# Patient Record
Sex: Male | Born: 1975 | Race: White | Hispanic: No
Health system: Midwestern US, Community
[De-identification: ages and names within clinical notes are randomized; demographics above are authoritative.]

## PROBLEM LIST (undated history)

## (undated) DIAGNOSIS — R000 Tachycardia, unspecified: Secondary | ICD-10-CM

## (undated) DIAGNOSIS — Z1211 Encounter for screening for malignant neoplasm of colon: Secondary | ICD-10-CM

## (undated) DIAGNOSIS — M549 Dorsalgia, unspecified: Secondary | ICD-10-CM

## (undated) DIAGNOSIS — K5792 Diverticulitis of intestine, part unspecified, without perforation or abscess without bleeding: Secondary | ICD-10-CM

## (undated) DIAGNOSIS — G894 Chronic pain syndrome: Secondary | ICD-10-CM

## (undated) DIAGNOSIS — M961 Postlaminectomy syndrome, not elsewhere classified: Secondary | ICD-10-CM

## (undated) DIAGNOSIS — D509 Iron deficiency anemia, unspecified: Secondary | ICD-10-CM

## (undated) DIAGNOSIS — R7989 Other specified abnormal findings of blood chemistry: Secondary | ICD-10-CM

## (undated) DIAGNOSIS — M5134 Other intervertebral disc degeneration, thoracic region: Secondary | ICD-10-CM

## (undated) DIAGNOSIS — J45909 Unspecified asthma, uncomplicated: Secondary | ICD-10-CM

## (undated) DIAGNOSIS — G43909 Migraine, unspecified, not intractable, without status migrainosus: Secondary | ICD-10-CM

## (undated) DIAGNOSIS — M47816 Spondylosis without myelopathy or radiculopathy, lumbar region: Secondary | ICD-10-CM

## (undated) HISTORY — PX: BUNIONECTOMY: SHX129

## (undated) HISTORY — PX: BACK SURGERY: SHX140

## (undated) HISTORY — PX: ELBOW SURGERY: SHX618

---

## 2016-09-06 ENCOUNTER — Emergency Department (HOSPITAL_BASED_OUTPATIENT_CLINIC_OR_DEPARTMENT_OTHER)
Admission: EM | Admit: 2016-09-06 | Discharge: 2016-09-06 | Disposition: A | Discharge status: Home / Self Care | Attending: Physician Assistant | Admitting: Physician Assistant

## 2016-09-06 ENCOUNTER — Emergency Department (HOSPITAL_BASED_OUTPATIENT_CLINIC_OR_DEPARTMENT_OTHER)

## 2016-09-06 ENCOUNTER — Encounter (HOSPITAL_BASED_OUTPATIENT_CLINIC_OR_DEPARTMENT_OTHER): Payer: Self-pay | Admitting: *Deleted

## 2016-09-06 DIAGNOSIS — R42 Dizziness and giddiness: Secondary | ICD-10-CM | POA: Diagnosis present

## 2016-09-06 DIAGNOSIS — R110 Nausea: Secondary | ICD-10-CM | POA: Insufficient documentation

## 2016-09-06 DIAGNOSIS — Z79899 Other long term (current) drug therapy: Secondary | ICD-10-CM | POA: Insufficient documentation

## 2016-09-06 HISTORY — DX: Dorsalgia, unspecified: M54.9

## 2016-09-06 HISTORY — DX: Migraine, unspecified, not intractable, without status migrainosus: G43.909

## 2016-09-06 LAB — CBC WITH DIFFERENTIAL/PLATELET
Basophils Absolute: 0 10*3/uL (ref 0.0–0.1)
Basophils Relative: 0 %
EOS ABS: 0.2 10*3/uL (ref 0.0–0.7)
EOS PCT: 2 %
HCT: 46.2 % (ref 39.0–52.0)
Hemoglobin: 15.7 g/dL (ref 13.0–17.0)
LYMPHS ABS: 1.9 10*3/uL (ref 0.7–4.0)
LYMPHS PCT: 24 %
MCH: 28 pg (ref 26.0–34.0)
MCHC: 34 g/dL (ref 30.0–36.0)
MCV: 82.5 fL (ref 78.0–100.0)
MONO ABS: 0.8 10*3/uL (ref 0.1–1.0)
MONOS PCT: 9 %
Neutro Abs: 5.1 10*3/uL (ref 1.7–7.7)
Neutrophils Relative %: 65 %
PLATELETS: 284 10*3/uL (ref 150–400)
RBC: 5.6 MIL/uL (ref 4.22–5.81)
RDW: 13.2 % (ref 11.5–15.5)
WBC: 8 10*3/uL (ref 4.0–10.5)

## 2016-09-06 LAB — COMPREHENSIVE METABOLIC PANEL
ALK PHOS: 72 U/L (ref 38–126)
ALT: 36 U/L (ref 17–63)
AST: 28 U/L (ref 15–41)
Albumin: 4.4 g/dL (ref 3.5–5.0)
Anion gap: 8 (ref 5–15)
BILIRUBIN TOTAL: 0.5 mg/dL (ref 0.3–1.2)
BUN: 18 mg/dL (ref 6–20)
CALCIUM: 9.6 mg/dL (ref 8.9–10.3)
CO2: 27 mmol/L (ref 22–32)
Chloride: 104 mmol/L (ref 101–111)
Creatinine, Ser: 1.06 mg/dL (ref 0.61–1.24)
GFR calc Af Amer: >60 mL/min (ref >60)
GFR calc non Af Amer: >60 mL/min (ref >60)
GLUCOSE: 98 mg/dL (ref 65–99)
POTASSIUM: 3.8 mmol/L (ref 3.5–5.1)
SODIUM: 139 mmol/L (ref 135–145)
TOTAL PROTEIN: 7.8 g/dL (ref 6.5–8.1)

## 2016-09-06 LAB — URINALYSIS, ROUTINE W REFLEX MICROSCOPIC
Bilirubin Urine: NEGATIVE
GLUCOSE, UA: NEGATIVE mg/dL
Hgb urine dipstick: NEGATIVE
KETONES UR: NEGATIVE mg/dL
LEUKOCYTES UA: NEGATIVE
Nitrite: NEGATIVE
PH: 6.5 (ref 5.0–8.0)
Protein, ur: NEGATIVE mg/dL
SPECIFIC GRAVITY, URINE: 1.022 (ref 1.005–1.030)

## 2016-09-06 LAB — TROPONIN I: Troponin I: <0.03 ng/mL (ref <0.03)

## 2016-09-06 LAB — CBG MONITORING, ED: Glucose-Capillary: 117 mg/dL — ABNORMAL HIGH (ref 65–99)

## 2016-09-06 NOTE — ED Triage Notes (Signed)
Pt reports multiple episodes of nausea, tremors, dizziness.  Has been seen and treated by PCP and has possible diabetes.  Pt drove himself here, is ambulatory.  Denies symptoms at this time.  Denies pain.

## 2016-09-06 NOTE — ED Notes (Addendum)
Alert, NAD, calm, interactive, resps e/u, speaking in clear complete sentences, no dyspnea noted, skin W&D, VSS, (denies: pain, sob, nausea, dizziness or visual changes at this time). Mentions concern for recent dizziness with associated nausea, which can occur while sitting, also increased thirst and frequency (denies sx at this time). [facility] work done. Fasting blood sugar was 127.

## 2016-09-06 NOTE — ED Provider Notes (Signed)
[facility] Note   CSN: [id number] Arrival date & time: 09/06/16  1727  By signing my name below, I, Octavia Heir, attest that this documentation has been prepared under the direction and in the presence of Sharilyn Sites, PA-C.  Electronically Signed: Octavia Heir, ED Scribe. 09/06/16. 6:29 PM.    History   Chief Complaint Chief Complaint  Patient presents with  . Dizziness   The history is provided by the patient. No language interpreter was used.    HPI Comments: Isaiah Gentry is a 41 y.o. male who presents to the Emergency Department complaining of intermittent, moderate episodes of "tremors" for the past couple of months. He reports associated light-headedness, dizziness that he describes as room-spinning, and nausea when this occurs. He further notes feeling very thirsty despite staying hydrated with water for the past several days. Pt says that his symptoms have been progressively worse over the past month, stating he has missed work for the first time in years. Pt says when an episode will occur, he will feel his face become hot, have nausea, and then the dizziness will appear. He notes the episodes occur late afternoon to early evening but for the past month it has been been recurring almost daily. Pt expresses he does not have any pain when his symptoms come on. Pt has been seen by his PCP and was told he may possibly have diabetes. Pt denies hx of cardiac problems or thyroid issues. He has not been on any new medications. He denies chest pain, shortness of breath, vomiting, and loss of appetite.   No focal numbness or weakness.  No blurred vision, confusion, changes in speech, difficulty walking.  No headaches or neck pain.  No seizure activity.  Past Medical History:  Diagnosis Date  . Back pain   . Migraine     There are no active problems to display for this patient.   History reviewed. No pertinent surgical history.     Home Medications     Prior to Admission medications   Medication Sig Start Date End Date Taking? Authorizing Provider  amitriptyline (ELAVIL) 50 MG tablet Take 50 mg by mouth at bedtime.   Yes Historical Provider, MD  HYDROcodone-acetaminophen (NORCO/VICODIN) 5-325 MG tablet Take 1 tablet by mouth every 6 (six) hours as needed for moderate pain.   Yes Historical Provider, MD  SUMAtriptan (IMITREX) 50 MG tablet Take 50 mg by mouth every 2 (two) hours as needed for migraine. May repeat in 2 hours if headache persists or recurs.   Yes Historical Provider, MD    Family History History reviewed. No pertinent family history.  Social History Social History  Substance Use Topics  . Smoking status: Never Smoker  . Smokeless tobacco: Never Used  . Alcohol use No     Allergies   Azithromycin   Review of Systems Review of Systems  Constitutional: Negative for appetite change and fever.  Respiratory: Negative for shortness of breath.   Cardiovascular: Negative for chest pain.  Gastrointestinal: Positive for nausea. Negative for vomiting.  Neurological: Positive for dizziness and light-headedness.  All other systems reviewed and are negative.    Physical Exam Updated Vital Signs BP (!) 140/105 (BP Location: Right Arm)   Pulse 87   Temp 97.5 F (36.4 C) (Oral)   Resp 16   Ht 6' (1.829 m)   Wt 245 lb (111.1 kg)   SpO2 100%   BMI 33.23 kg/m   Physical Exam  Constitutional: He is  oriented to person, place, and time. He appears well-developed and well-nourished. No distress.  HENT:  Head: Normocephalic and atraumatic.  Right Ear: External ear normal.  Left Ear: External ear normal.  Mouth/Throat: Oropharynx is clear and moist.  Eyes: Conjunctivae and EOM are normal. Pupils are equal, round, and reactive to light.  Neck: Normal range of motion and full passive range of motion without pain. Neck supple. No neck rigidity.  No rigidity, no meningismus  Cardiovascular: Normal rate, regular rhythm and  normal heart sounds.   No murmur heard. Pulmonary/Chest: Effort normal and breath sounds normal. No respiratory distress. He has no wheezes. He has no rhonchi.  Abdominal: Soft. Bowel sounds are normal. There is no tenderness. There is no rebound and no guarding.  Musculoskeletal: Normal range of motion. He exhibits no edema.  Neurological: He is alert and oriented to person, place, and time. He has normal strength. He displays no tremor. No cranial nerve deficit or sensory deficit. He displays no seizure activity.  AAOx3, answering questions and following commands appropriately; equal strength UE and LE bilaterally; CN grossly intact; moves all extremities appropriately without ataxia; no focal neuro deficits or facial asymmetry appreciated; no tremors or seizure activity  Skin: Skin is warm and dry. No rash noted. He is not diaphoretic.  Psychiatric: He has a normal mood and affect. His behavior is normal. Thought content normal.  Nursing note and vitals reviewed.    ED Treatments / Results  DIAGNOSTIC STUDIES: Oxygen Saturation is 100% on RA, normal by my interpretation.  COORDINATION OF CARE:  6:28 PM Discussed treatment plan with pt at bedside and pt agreed to plan.  Labs (all labs ordered are listed, but only abnormal results are displayed) Labs Reviewed  CBG MONITORING, ED - Abnormal; Notable for the following:       Result Value   Glucose-Capillary 117 (*)    All other components within normal limits  CBC WITH DIFFERENTIAL/PLATELET  COMPREHENSIVE METABOLIC PANEL  TROPONIN I  URINALYSIS, ROUTINE W REFLEX MICROSCOPIC  TSH  T4, FREE    EKG  EKG Interpretation None       Radiology Dg Chest 2 View  Result Date: 09/06/2016 CLINICAL DATA:  Patient with acute onset dizziness and nausea. EXAM: CHEST  2 VIEW COMPARISON:  None. FINDINGS: The heart size and mediastinal contours are within normal limits. Both lungs are clear. The visualized skeletal structures are  unremarkable. IMPRESSION: No active cardiopulmonary disease. Electronically Signed   By: Annia Beltrew  Davis M.D.   On: 09/06/2016 19:24    Procedures Procedures (including critical care time)  Medications Ordered in ED Medications - No data to display   Initial Impression / Assessment and Plan / ED Course  I have reviewed the triage vital signs and the nursing notes.  Pertinent labs & imaging results that were available during my care of the patient were reviewed by me and considered in my medical decision making (see chart for details).  41 year old male here with episodic dizziness, facial flushing, and tremors. Has been increasing in frequency over the past month. He is afebrile and nontoxic. His neurologic exam is nonfocal. No signs or symptoms concerning for seizures. Labwork obtained, no acute findings. EKG non-ischemic.  CXR clear.  BP mildly elevated on arrival here, has normalized here without intervention.  Has remained asymptomatic here in emergency department. Neurologic exam non-focal.  Thyroid studies have been sent and should result sometime tomorrow. These will be forwarded to his primary care doctor. I recommended that  he follow-up closely with his family doctor for ongoing workup as etiology of his symptoms unknown at this time.  Discussed plan with patient, he acknowledged understanding and agreed with plan of care.  Return precautions given for new or worsening symptoms.  Final Clinical Impressions(s) / ED Diagnoses   Final diagnoses:  Episodic lightheadedness    New Prescriptions New Prescriptions   No medications on file   I personally performed the services described in this documentation, which was scribed in my presence. The recorded information has been reviewed and is accurate.   Garlon Hatchet, PA-C 09/06/16 2229    Courteney Randall An, MD 09/06/16 [id number]

## 2016-09-06 NOTE — ED Notes (Signed)
Patient transported to X-ray 

## 2016-09-06 NOTE — Discharge Instructions (Signed)
Your lab work today looks normal. Your thyroid study should come back tomorrow. These will be forwarded to your primary care doctor. If these are abnormal, you'll be notified. Recommend to follow-up with your doctor next week. Return here for new/worsening symptoms-- chest pain, shortness of breath, syncope, etc.

## 2016-09-06 NOTE — ED Notes (Addendum)
Updated, NAD, calm, VSS, EDPA into room.

## 2016-09-07 LAB — TSH: TSH: 2.423 u[IU]/mL (ref 0.350–4.500)

## 2016-09-07 LAB — T4, FREE: Free T4: 0.77 ng/dL (ref 0.61–1.12)

## 2018-03-04 ENCOUNTER — Emergency Department (HOSPITAL_BASED_OUTPATIENT_CLINIC_OR_DEPARTMENT_OTHER)
Admission: EM | Admit: 2018-03-04 | Discharge: 2018-03-04 | Disposition: A | Discharge status: Home / Self Care | Payer: No Typology Code available for payment source | Attending: Emergency Medicine | Admitting: Emergency Medicine

## 2018-03-04 ENCOUNTER — Other Ambulatory Visit: Payer: Self-pay

## 2018-03-04 ENCOUNTER — Emergency Department (HOSPITAL_BASED_OUTPATIENT_CLINIC_OR_DEPARTMENT_OTHER): Payer: No Typology Code available for payment source

## 2018-03-04 ENCOUNTER — Encounter (HOSPITAL_BASED_OUTPATIENT_CLINIC_OR_DEPARTMENT_OTHER): Payer: Self-pay | Admitting: Emergency Medicine

## 2018-03-04 DIAGNOSIS — Z79899 Other long term (current) drug therapy: Secondary | ICD-10-CM | POA: Insufficient documentation

## 2018-03-04 DIAGNOSIS — R109 Unspecified abdominal pain: Secondary | ICD-10-CM | POA: Diagnosis present

## 2018-03-04 DIAGNOSIS — N200 Calculus of kidney: Secondary | ICD-10-CM | POA: Insufficient documentation

## 2018-03-04 HISTORY — DX: Diverticulitis of intestine, part unspecified, without perforation or abscess without bleeding: K57.92

## 2018-03-04 HISTORY — DX: Other intervertebral disc degeneration, thoracic region: M51.34

## 2018-03-04 HISTORY — DX: Unspecified asthma, uncomplicated: J45.909

## 2018-03-04 HISTORY — DX: Spondylosis without myelopathy or radiculopathy, lumbar region: M47.816

## 2018-03-04 LAB — CBC WITH DIFFERENTIAL/PLATELET
Basophils Absolute: 0 10*3/uL (ref 0.0–0.1)
Basophils Relative: 0 %
EOS PCT: 3 %
Eosinophils Absolute: 0.3 10*3/uL (ref 0.0–0.7)
HCT: 45.1 % (ref 39.0–52.0)
Hemoglobin: 15.4 g/dL (ref 13.0–17.0)
LYMPHS ABS: 3 10*3/uL (ref 0.7–4.0)
LYMPHS PCT: 35 %
MCH: 28.3 pg (ref 26.0–34.0)
MCHC: 34.1 g/dL (ref 30.0–36.0)
MCV: 82.9 fL (ref 78.0–100.0)
MONO ABS: 0.7 10*3/uL (ref 0.1–1.0)
MONOS PCT: 8 %
Neutro Abs: 4.6 10*3/uL (ref 1.7–7.7)
Neutrophils Relative %: 54 %
PLATELETS: 277 10*3/uL (ref 150–400)
RBC: 5.44 MIL/uL (ref 4.22–5.81)
RDW: 12.8 % (ref 11.5–15.5)
WBC: 8.6 10*3/uL (ref 4.0–10.5)

## 2018-03-04 LAB — BASIC METABOLIC PANEL
Anion gap: 11 (ref 5–15)
BUN: 18 mg/dL (ref 6–20)
CHLORIDE: 105 mmol/L (ref 98–111)
CO2: 24 mmol/L (ref 22–32)
Calcium: 9 mg/dL (ref 8.9–10.3)
Creatinine, Ser: 1.07 mg/dL (ref 0.61–1.24)
GFR calc Af Amer: >60 mL/min (ref >60)
GFR calc non Af Amer: >60 mL/min (ref >60)
GLUCOSE: 134 mg/dL — AB (ref 70–99)
POTASSIUM: 3.7 mmol/L (ref 3.5–5.1)
Sodium: 140 mmol/L (ref 135–145)

## 2018-03-04 LAB — URINALYSIS, MICROSCOPIC (REFLEX): Bacteria, UA: NONE SEEN

## 2018-03-04 LAB — URINALYSIS, ROUTINE W REFLEX MICROSCOPIC
Bilirubin Urine: NEGATIVE
GLUCOSE, UA: NEGATIVE mg/dL
KETONES UR: 15 mg/dL — AB
Leukocytes, UA: NEGATIVE
Nitrite: NEGATIVE
PROTEIN: NEGATIVE mg/dL
Specific Gravity, Urine: 1.02 (ref 1.005–1.030)
pH: 6 (ref 5.0–8.0)

## 2018-03-04 MED ORDER — HYDROMORPHONE HCL 1 MG/ML IJ SOLN
1.0000 mg | Freq: Once | INTRAMUSCULAR | Status: AC
  Administered 2018-03-04: 1 mg via INTRAVENOUS

## 2018-03-04 MED ORDER — ONDANSETRON HCL 4 MG/2ML IJ SOLN
4.0000 mg | Freq: Once | INTRAMUSCULAR | Status: AC
  Administered 2018-03-04: 4 mg via INTRAVENOUS
  Filled 2018-03-04: qty 2

## 2018-03-04 MED ORDER — MORPHINE SULFATE (PF) 4 MG/ML IV SOLN
4.0000 mg | Freq: Once | INTRAVENOUS | Status: AC
  Administered 2018-03-04: 4 mg via INTRAVENOUS
  Filled 2018-03-04: qty 1

## 2018-03-04 MED ORDER — KETOROLAC TROMETHAMINE 30 MG/ML IJ SOLN
30.0000 mg | Freq: Once | INTRAMUSCULAR | Status: AC
  Administered 2018-03-04: 30 mg via INTRAVENOUS
  Filled 2018-03-04: qty 1

## 2018-03-04 MED ORDER — OXYCODONE-ACETAMINOPHEN 5-325 MG PO TABS: 1.0000 | ORAL_TABLET | Freq: Four times a day (QID) | ORAL | 0 refills | Status: DC | PRN

## 2018-03-04 MED ORDER — HYDROMORPHONE HCL 1 MG/ML IJ SOLN
INTRAMUSCULAR | Status: AC
  Administered 2018-03-04: 1 mg via INTRAVENOUS
  Filled 2018-03-04: qty 1

## 2018-03-04 NOTE — Discharge Instructions (Signed)
Percocet as prescribed as needed for pain.  Follow-up with urology if the stone has not passed through the weekend.  The contact information for [facility] has been provided in this discharge summary for you to call and make these arrangements.  Return to the emergency department in the meantime if you develop worsening pain, high fevers, or other new and concerning symptoms.

## 2018-03-04 NOTE — ED Triage Notes (Signed)
Pt c/o right sided lower abd pain that radiates to right flank. Sudden onset of pain x 1 hour ago.

## 2018-03-04 NOTE — ED Provider Notes (Addendum)
[facility] EMERGENCY DEPARTMENT Provider Note   CSN: [id number] Arrival date & time: 03/04/18  0146     History   Chief Complaint Chief Complaint  Patient presents with  . Flank Pain    HPI Chester HolsteinJason P Gura is a 42 y.o. male.  Patient is a 42 year old male with past medical history of degenerative disc disease awaiting surgery.  He presents today for evaluation of right flank pain.  This began suddenly and woke him from sleep.  He reports a severe pain radiating from his right lower quadrant into his right flank with associated nausea and diaphoresis.  He cannot find a comfortable position.  He denies any urinary complaints.  He denies any diarrhea, constipation, or vomiting.  The history is provided by the patient.  Flank Pain  This is a new problem. The current episode started 1 to 2 hours ago. The problem occurs constantly. The problem has been rapidly worsening. Nothing aggravates the symptoms. Nothing relieves the symptoms. He has tried nothing for the symptoms.    Past Medical History:  Diagnosis Date  . Back pain   . Migraine     There are no active problems to display for this patient.   History reviewed. No pertinent surgical history.      Home Medications    Prior to Admission medications   Medication Sig Start Date End Date Taking? Authorizing Provider  morphine (MS CONTIN) 15 MG 12 hr tablet Take 15 mg by mouth every 12 (twelve) hours.   Yes [provider]  tiZANidine (ZANAFLEX) 4 MG tablet Take 8 mg by mouth at bedtime as needed for muscle spasms.    Yes [provider]  amitriptyline (ELAVIL) 50 MG tablet Take 50 mg by mouth at bedtime.    [provider]  SUMAtriptan (IMITREX) 50 MG tablet Take 50 mg by mouth every 2 (two) hours as needed for migraine. May repeat in 2 hours if headache persists or recurs.    [provider]    Family History No family history on file.  Social History Social History    Tobacco Use  . Smoking status: Never Smoker  . Smokeless tobacco: Never Used  Substance Use Topics  . Alcohol use: No  . Drug use: No     Allergies   Azithromycin   Review of Systems Review of Systems  Genitourinary: Positive for flank pain.  All other systems reviewed and are negative.    Physical Exam Updated Vital Signs BP (!) 149/89   Pulse 97   Temp 97.7 F (36.5 C) (Oral)   Resp (!) 22   Ht 6' (1.829 m)   Wt 113.4 kg   SpO2 98%   BMI 33.91 kg/m   Physical Exam  Constitutional: He is oriented to person, place, and time. He appears well-developed and well-nourished. No distress.  HENT:  Head: Normocephalic and atraumatic.  Mouth/Throat: Oropharynx is clear and moist.  Neck: Normal range of motion. Neck supple.  Cardiovascular: Normal rate and regular rhythm. Exam reveals no friction rub.  No murmur heard. Pulmonary/Chest: Effort normal and breath sounds normal. No respiratory distress. He has no wheezes. He has no rales.  Abdominal: Soft. Bowel sounds are normal. He exhibits no distension. There is no tenderness.  There is mild right-sided CVA tenderness.  Musculoskeletal: Normal range of motion. He exhibits no edema.  Neurological: He is alert and oriented to person, place, and time. Coordination normal.  Skin: Skin is warm and dry. He is  not diaphoretic.  Nursing note and vitals reviewed.    ED Treatments / Results  Labs (all labs ordered are listed, but only abnormal results are displayed) Labs Reviewed  BASIC METABOLIC PANEL  CBC WITH DIFFERENTIAL/PLATELET  URINALYSIS, ROUTINE W REFLEX MICROSCOPIC    EKG None  Radiology No results found.  Procedures Procedures (including critical care time)  Medications Ordered in ED Medications  ondansetron (ZOFRAN) injection 4 mg (has no administration in time range)  morphine 4 MG/ML injection 4 mg (has no administration in time range)  ketorolac (TORADOL) 30 MG/ML injection 30 mg (has no  administration in time range)     Initial Impression / Assessment and Plan / ED Course  I have reviewed the triage vital signs and the nursing notes.  Pertinent labs & imaging results that were available during my care of the patient were reviewed by me and considered in my medical decision making (see chart for details).  CT scan shows a 3 mm renal calculus at the right UVJ.  He is feeling better after pain and nausea medicine.  He will be discharged with Percocet which she can take as needed for pain.  He is on Q12-hour Morphine for back issues and he can take this Percocet as needed for breakthrough pain.  [facility] to [facility].  Final Clinical Impressions(s) / ED Diagnoses   Final diagnoses:  None    ED Discharge Orders    None       Geoffery Lyonselo, Johanna Stafford, MD 03/04/18 [id number]    Geoffery Lyonselo, Luisdavid Hamblin, MD 03/04/18 [id number]

## 2018-03-04 NOTE — ED Notes (Signed)
Patient transported to CT 

## 2018-10-27 ENCOUNTER — Encounter (HOSPITAL_BASED_OUTPATIENT_CLINIC_OR_DEPARTMENT_OTHER): Payer: Self-pay

## 2018-10-27 ENCOUNTER — Emergency Department (HOSPITAL_BASED_OUTPATIENT_CLINIC_OR_DEPARTMENT_OTHER): Payer: No Typology Code available for payment source

## 2018-10-27 ENCOUNTER — Emergency Department (HOSPITAL_BASED_OUTPATIENT_CLINIC_OR_DEPARTMENT_OTHER)
Admission: EM | Admit: 2018-10-27 | Discharge: 2018-10-27 | Disposition: A | Discharge status: Other Acute Inpatient Hospital | Payer: No Typology Code available for payment source | Attending: Emergency Medicine | Admitting: Emergency Medicine

## 2018-10-27 ENCOUNTER — Other Ambulatory Visit: Payer: Self-pay

## 2018-10-27 DIAGNOSIS — D72829 Elevated white blood cell count, unspecified: Secondary | ICD-10-CM | POA: Insufficient documentation

## 2018-10-27 DIAGNOSIS — Z79899 Other long term (current) drug therapy: Secondary | ICD-10-CM | POA: Diagnosis not present

## 2018-10-27 DIAGNOSIS — R112 Nausea with vomiting, unspecified: Secondary | ICD-10-CM | POA: Diagnosis not present

## 2018-10-27 DIAGNOSIS — R1031 Right lower quadrant pain: Secondary | ICD-10-CM | POA: Insufficient documentation

## 2018-10-27 DIAGNOSIS — R1032 Left lower quadrant pain: Secondary | ICD-10-CM

## 2018-10-27 DIAGNOSIS — J45909 Unspecified asthma, uncomplicated: Secondary | ICD-10-CM | POA: Diagnosis not present

## 2018-10-27 LAB — CBC WITH DIFFERENTIAL/PLATELET
Abs Immature Granulocytes: 0.04 10*3/uL (ref 0.00–0.07)
Basophils Absolute: 0 10*3/uL (ref 0.0–0.1)
Basophils Relative: 0 %
Eosinophils Absolute: 0.1 10*3/uL (ref 0.0–0.5)
Eosinophils Relative: 1 %
HCT: 45.9 % (ref 39.0–52.0)
Hemoglobin: 14.7 g/dL (ref 13.0–17.0)
Immature Granulocytes: 0 %
Lymphocytes Relative: 13 %
Lymphs Abs: 1.5 10*3/uL (ref 0.7–4.0)
MCH: 24.4 pg — ABNORMAL LOW (ref 26.0–34.0)
MCHC: 32 g/dL (ref 30.0–36.0)
MCV: 76.2 fL — ABNORMAL LOW (ref 80.0–100.0)
Monocytes Absolute: 0.7 10*3/uL (ref 0.1–1.0)
Monocytes Relative: 6 %
Neutro Abs: 9.8 10*3/uL — ABNORMAL HIGH (ref 1.7–7.7)
Neutrophils Relative %: 80 %
Platelets: 396 10*3/uL (ref 150–400)
RBC: 6.02 MIL/uL — ABNORMAL HIGH (ref 4.22–5.81)
RDW: 15.8 % — ABNORMAL HIGH (ref 11.5–15.5)
WBC: 12.1 10*3/uL — ABNORMAL HIGH (ref 4.0–10.5)
nRBC: 0 % (ref 0.0–0.2)

## 2018-10-27 LAB — COMPREHENSIVE METABOLIC PANEL
ALT: 22 U/L (ref 0–44)
AST: 23 U/L (ref 15–41)
Albumin: 4.7 g/dL (ref 3.5–5.0)
Alkaline Phosphatase: 126 U/L (ref 38–126)
Anion gap: 12 (ref 5–15)
BUN: 16 mg/dL (ref 6–20)
CO2: 19 mmol/L — ABNORMAL LOW (ref 22–32)
Calcium: 9.9 mg/dL (ref 8.9–10.3)
Chloride: 104 mmol/L (ref 98–111)
Creatinine, Ser: 0.98 mg/dL (ref 0.61–1.24)
GFR calc Af Amer: >60 mL/min (ref >60)
GFR calc non Af Amer: >60 mL/min (ref >60)
Glucose, Bld: 116 mg/dL — ABNORMAL HIGH (ref 70–99)
Potassium: 3.5 mmol/L (ref 3.5–5.1)
Sodium: 135 mmol/L (ref 135–145)
Total Bilirubin: 0.6 mg/dL (ref 0.3–1.2)
Total Protein: 8.7 g/dL — ABNORMAL HIGH (ref 6.5–8.1)

## 2018-10-27 LAB — URINALYSIS, ROUTINE W REFLEX MICROSCOPIC
Bilirubin Urine: NEGATIVE
Glucose, UA: NEGATIVE mg/dL
Hgb urine dipstick: NEGATIVE
Ketones, ur: 40 mg/dL — AB
Leukocytes,Ua: NEGATIVE
Nitrite: NEGATIVE
Protein, ur: NEGATIVE mg/dL
Specific Gravity, Urine: <1.005 — ABNORMAL LOW (ref 1.005–1.030)
pH: 6.5 (ref 5.0–8.0)

## 2018-10-27 MED ORDER — ONDANSETRON HCL 4 MG/2ML IJ SOLN
4.0000 mg | Freq: Once | INTRAMUSCULAR | Status: AC
  Administered 2018-10-27: 4 mg via INTRAVENOUS
  Filled 2018-10-27: qty 2

## 2018-10-27 MED ORDER — IOHEXOL 300 MG/ML  SOLN
100.0000 mL | Freq: Once | INTRAMUSCULAR | Status: AC | PRN
  Administered 2018-10-27: 100 mL via INTRAVENOUS

## 2018-10-27 MED ORDER — IOPAMIDOL (ISOVUE-300) INJECTION 61%
30.0000 mL | Freq: Once | INTRAVENOUS | Status: AC | PRN
  Administered 2018-10-27: 15 mL via ORAL

## 2018-10-27 MED ORDER — HYDROMORPHONE HCL 1 MG/ML IJ SOLN
1.0000 mg | Freq: Once | INTRAMUSCULAR | Status: AC
  Administered 2018-10-27: 1 mg via INTRAVENOUS
  Filled 2018-10-27: qty 1

## 2018-10-27 MED ORDER — SODIUM CHLORIDE 0.9 % IV BOLUS
1000.0000 mL | Freq: Once | INTRAVENOUS | Status: AC
  Administered 2018-10-27: 1000 mL via INTRAVENOUS

## 2018-10-27 NOTE — ED Provider Notes (Signed)
[facility] Provider Note   CSN: [id number] Arrival date & time: 10/27/18  1324    History   Chief Complaint Chief Complaint  Patient presents with   Abdominal Pain    HPI Isaiah Gentry is a 43 y.o. male.     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  Patient reports that he has had pain for the past 24 hours.  Patient reports that he is not been able to tolerate orals since approximately 6 AM yesterday.  Patient reports that he is on chronic opiate therapy and has not had opiates since 6 AM yesterday.  He is on long-acting morphine and every 4 hour Percocet PRN.  Patient reports that he has vomited approximately 3-4 times per day, nonbilious and nonbloody.  He is also had several episodes of loose stool over the past 24 hours without melena or hematochezia.  Patient reports that the pain feels like a "cramp" in his left lower quadrant and feels similar to his prior episodes of diverticulitis.  Patient denies any recorded fevers at home.  He denies any dysuria, urgency, frequency, or testicular pain.  Patient reports that per his infectious disease physician who works at both the [place] and [facility], his ESR and sed rate have remained slightly elevated, however he could not keep the PICC line in longer than 9 weeks and is now just on oral Keflex.  Past Medical History:  Diagnosis Date   Asthma    Back pain    DDD (degenerative disc disease), thoracic    Diverticulitis    Migraine    Osteoarthritis of lumbar spine     There are no active problems to display for this patient.   Past Surgical History:   Procedure Laterality Date   BACK SURGERY     BUNIONECTOMY     ELBOW SURGERY          Home Medications    Prior to Admission medications   Medication Sig Start Date End Date Taking? Authorizing Provider  albuterol (PROVENTIL HFA;VENTOLIN HFA) 108 (90 Base) MCG/ACT inhaler Inhale 2 puffs into the lungs every 6 (six) hours as needed for wheezing or shortness of breath.    [provider]  budesonide-formoterol (SYMBICORT) 160-4.5 MCG/ACT inhaler Inhale 2 puffs into the lungs 2 (two) times daily.    [provider]  cetirizine (ZYRTEC) 10 MG tablet Take 10 mg by mouth daily.    [provider]  DULoxetine (CYMBALTA) 60 MG capsule Take 60 mg by mouth daily.    [provider]  meloxicam (MOBIC) 7.5 MG tablet Take 7.5 mg by mouth daily.    [provider]  montelukast (SINGULAIR) 10 MG tablet Take 10 mg by mouth daily.    [provider]  morphine (MS CONTIN) 15 MG 12 hr tablet Take 15 mg by mouth every 12 (twelve) hours.    [provider]  oxyCODONE-acetaminophen (PERCOCET) 5-325 MG tablet Take 1-2 tablets by mouth every 6 (six) hours as needed. 03/04/18   Veryl Speak, MD  tiZANidine (ZANAFLEX) 4 MG tablet Take 8 mg by mouth at bedtime as needed for muscle spasms.     [provider]  Family History No family history on file.  Social History Social History   Tobacco Use   Smoking status: Never Smoker   Smokeless tobacco: Never Used  Substance Use Topics   Alcohol use: No   Drug use: No     Allergies   Erythromycin   Review of Systems Review of Systems  Constitutional: Negative for chills and fever.  HENT: Negative for congestion, rhinorrhea, sinus pain and sore throat.   Eyes: Negative for visual disturbance.  Respiratory: Negative for cough and chest tightness.   Cardiovascular: Negative for chest pain, palpitations and leg swelling.  Gastrointestinal: Positive for abdominal pain,  diarrhea, nausea and vomiting. Negative for constipation.  Genitourinary: Negative for dysuria and flank pain.  Musculoskeletal: Negative for back pain and myalgias.  Skin: Negative for rash.  Neurological: Negative for dizziness, syncope and headaches.     Physical Exam Updated Vital Signs BP (!) 123/100 (BP Location: Left Arm)    Pulse 90    Resp 20    Ht '6\' 2"'  (1.88 m)    Wt 117.9 kg    SpO2 100%    BMI 33.38 kg/m   Physical Exam Vitals signs and nursing note reviewed.  Constitutional:      General: He is not in acute distress.    Appearance: He is well-developed.  HENT:     Head: Normocephalic and atraumatic.  Eyes:     Conjunctiva/sclera: Conjunctivae normal.     Pupils: Pupils are equal, round, and reactive to light.  Neck:     Musculoskeletal: Normal range of motion and neck supple.  Cardiovascular:     Rate and Rhythm: Normal rate and regular rhythm.     Heart sounds: S1 normal and S2 normal. No murmur.  Pulmonary:     Effort: Pulmonary effort is normal.     Breath sounds: Normal breath sounds. No wheezing or rales.  Abdominal:     General: Bowel sounds are normal. There is no distension.     Palpations: Abdomen is soft.     Tenderness: There is abdominal tenderness in the left lower quadrant. There is no guarding.     Comments: Well healed surgical scar of RLQ. Pain to palpation just above ASIS.   Musculoskeletal: Normal range of motion.        General: No deformity.     Comments: TTP of lower lumbar region and left paraspinal musculature. Well healed surgical scar of lumbar region.  Lymphadenopathy:     Cervical: No cervical adenopathy.  Skin:    General: Skin is warm and dry.     Findings: No erythema or rash.     Comments: No rash of back or abdomen.   Neurological:     Mental Status: He is alert.     Comments: Cranial nerves grossly intact. Strength 5 out of 5 bilateral lower extremities with flexion, extension hips, knee flexion/extension, and  dorsiflexion plantarflexion.  Normal and symmetric gait with no evidence of foot drop. Patient moves extremities symmetrically and with good coordination.  Psychiatric:        Behavior: Behavior normal.        Thought Content: Thought content normal.        Judgment: Judgment normal.      ED Treatments / Results  Labs (all labs ordered are listed, but only abnormal results are displayed) Labs Reviewed  COMPREHENSIVE METABOLIC PANEL - Abnormal; Notable for the following components:      Result Value   CO2 19 (*)  Glucose, Bld 116 (*)    Total Protein 8.7 (*)    All other components within normal limits  CBC WITH DIFFERENTIAL/PLATELET - Abnormal; Notable for the following components:   WBC 12.1 (*)    RBC 6.02 (*)    MCV 76.2 (*)    MCH 24.4 (*)    RDW 15.8 (*)    Neutro Abs 9.8 (*)    All other components within normal limits  URINALYSIS, ROUTINE W REFLEX MICROSCOPIC - Abnormal; Notable for the following components:   Specific Gravity, Urine <1.005 (*)    Ketones, ur 40 (*)    All other components within normal limits  URINE CULTURE  CULTURE, BLOOD (ROUTINE X 2)  CULTURE, BLOOD (ROUTINE X 2)    EKG None  Radiology Ct Abdomen Pelvis W Contrast  Result Date: 10/27/2018 CLINICAL DATA:  Lower abdominal pain with nausea and vomiting EXAM: CT ABDOMEN AND PELVIS WITH CONTRAST TECHNIQUE: Multidetector CT imaging of the abdomen and pelvis was performed using the standard protocol following bolus administration of intravenous contrast. Oral contrast was also administered. CONTRAST:  75m ISOVUE-300 IOPAMIDOL (ISOVUE-300) INJECTION 61%, 1025mOMNIPAQUE IOHEXOL 300 MG/ML SOLN COMPARISON:  April 29, 2018 FINDINGS: Lower chest: There is no demonstrable lung base edema or consolidation. Hepatobiliary: No focal liver lesions are apparent. Gallbladder wall is not appreciably thickened. There is no biliary duct dilatation. Pancreas: No pancreatic mass or inflammatory focus. Spleen: No  splenic lesions are evident. Adrenals/Urinary Tract: Adrenals bilaterally appear unremarkable. Kidneys bilaterally show no evident mass or hydronephrosis on either side. There is no renal or ureteral calculus on either side. Urinary bladder is midline with wall thickness increased diffusely. Stomach/Bowel: There are occasional sigmoid diverticula. No evident diverticulitis. There is no appreciable bowel wall or mesenteric thickening. There is no appreciable bowel obstruction. There is no free air or portal venous air. Vascular/Lymphatic: There is no abdominal aortic aneurysm. No vascular lesions are appreciable. There is no adenopathy in the abdomen or pelvis. Reproductive: There are prostatic calculi present. Prostate and seminal vesicles appear normal in size and contour. No pelvic mass evident. Other: Appendix appears normal. There is no abscess or ascites evident in the abdomen or pelvis. Musculoskeletal: There is postoperative change at L5 and S1. Previous disc spacer at L5-S1 is no longer evident. There is irregularity along the inferior aspect of the L5 vertebral body, a finding not present previously. Other bony structures appear intact. No intramuscular or abdominal wall lesions are evident. IMPRESSION: 1. Postoperative change at L5 and S1. Disc spacer no longer appreciable at L5-S1. Irregularity is noted along the inferior aspect of the L5 vertebral body. While this appearance may be of postoperative etiology, the appearance on CT is concerning for possible degree of discitis and osteomyelitis involving the inferior aspect of the L5 vertebral body. 2. Diffuse urinary bladder wall thickening, a finding concerning for a degree of cystitis. 3. Occasional sigmoid diverticula without diverticulitis. No bowel obstruction. No abscess in the abdomen or pelvis. Appendix appears normal. 4. No renal or ureteral calculi. No hydronephrosis. There are prostatic calculi. Electronically Signed   By: WiLowella GripII  M.D.   On: 10/27/2018 16:24    Procedures Procedures (including critical care time)  Medications Ordered in ED Medications  ondansetron (ZOFRAN) injection 4 mg (has no administration in time range)  sodium chloride 0.9 % bolus 1,000 mL (0 mLs Intravenous Stopped 10/27/18 1556)  HYDROmorphone (DILAUDID) injection 1 mg (1 mg Intravenous Given 10/27/18 1406)  ondansetron (ZOFRAN) injection 4  mg (4 mg Intravenous Given 10/27/18 1405)  HYDROmorphone (DILAUDID) injection 1 mg (1 mg Intravenous Given 10/27/18 1442)  iohexol (OMNIPAQUE) 300 MG/ML solution 100 mL (100 mLs Intravenous Contrast Given 10/27/18 1559)  iopamidol (ISOVUE-300) 61 % injection 30 mL (15 mLs Oral Contrast Given 10/27/18 1558)  HYDROmorphone (DILAUDID) injection 1 mg (1 mg Intravenous Given 10/27/18 1708)     Initial Impression / Assessment and Plan / ED Course  I have reviewed the triage vital signs and the nursing notes.  Pertinent labs & imaging results that were available during my care of the patient were reviewed by me and considered in my medical decision making (see chart for details).  Clinical Course as of Oct 26 1849  Wed Oct 27, 2018  1438 Pt reporting continuous severe pain. Pt has high opiate tolerance. Will order dilaudid.    [AM]  6578 Likely 2/2 poor oral intake.   CO2(!): 19 [AM]  4696 Not consistent with infection.   Urinalysis, Routine w reflex microscopic(!) [AM]  1706 Finding of thickened urinary bladder appears to be chronic.   CT Abdomen Pelvis W Contrast [AM]  2952 Spoke with NP Ratliff of infectious disease at [facility] hospital who states that given the patient is afebrile here and has no neuro symptoms in the lower extremities, the CT findings are not a clear diagnosis of discitis/osteomyelitis.  At this time, she does not feel that he has an admittable diagnosis withot further imaging. She would recommend MRI lumbar spine with and without contrast.  Appreciate her involvement in the care of  this patient.   [AM]  8413 Spoke with Dr. Nelva Bush of emergency medicine at [facility] hospital who accepts patient for transfer.  We will transfer for MRI of the lumbar spine with and without contrast.   [AM]    Clinical Course User Index [AM] Albesa Seen, PA-C       Patient nontoxic-appearing, afebrile, hemodynamically stable with nonsurgical abdomen.  Differential diagnosis includes diverticulitis, ureterolithiasis, appendicitis, cystitis, gastroenteritis.  Also considering ongoing or worsening discitis/osteomyelitis of the spine given patient's persistently elevated ESR/CRP and ongoing issues with this per patient.   Workup demonstrating leukocytosis of  12.1 (last 5.5 on 10/04/2018) with left shift. CO2 19.  Blood cultures are pending. CT abdomen and pelvis demonstrating bladder wall thickening, recommend correlate with urinalysis.  Urinalysis is clean without evidence of infection.  His prior noncontrasted CT in January 2020 did also demonstrate bladder wall thickening.  Will culture urine.  No evidence of diverticulitis, appendicitis, or other acute intra-abdominal abnormality on CT scan.  However, radiology read is noting "irregularity is noted along the inferior aspect of the L5 vertebral body.  While this appearance may be a postoperative etiology, the appearance on CT is concerning for possible degree of discitis and osteomyelitis involving the inferior aspect the L5 vertebral body."  This is a finding not present on previous CT and PACS system.  5:38 PM reassessment, patient is reporting that he has had constitutional symptoms similar to when he had osteomyelitis in January 2020.  He reports that these include fatigue, night sweats, and "just not feeling right". Previous patient did not have neurologic symptoms with prior episodes osteomyelitis/discitis.  Per discussions above, patient not meeting criteria for admission for confirm diagnosis of osteomyelitis/discitis of the  L5-S1 region as previously seen.  Will transfer to [facility] for MRI.  Excepting is Dr. Nelva Bush.  Appreciate his involvement.  Patient comfortable and ambulatory with no evidence  of neurologic compromise in bilateral lower extremities at time of transfer.  This is a supervised visit with Dr. Blanchie Dessert. Evaluation, management, and discharge planning discussed with this attending physician.  Final Clinical Impressions(s) / ED Diagnoses   Final diagnoses:  LLQ abdominal pain  Leukocytosis, unspecified type  Non-intractable vomiting with nausea, unspecified vomiting type    ED Discharge Orders    None       Tamala Julian 10/27/18 Lilli Light, MD 11/08/18 2232

## 2018-10-27 NOTE — ED Notes (Signed)
Patient transported to CT 

## 2018-10-27 NOTE — ED Triage Notes (Signed)
C/o abd pain n/v/d x 2 days-states feels same as diverticulitis pain-grimacing

## 2018-10-27 NOTE — Discharge Instructions (Signed)
Please to directly to the emergency department at [facility] immediately after leaving the emergency department.

## 2018-10-27 NOTE — ED Notes (Signed)
Pt's IVs wrapped, transferred to [facility] POV. Nurse walked pt out to the parking lot and he has his paperwork and wife is driving.

## 2018-10-27 NOTE — ED Notes (Signed)
Pt returned from CT °

## 2018-10-28 LAB — URINE CULTURE: Culture: NO GROWTH

## 2018-10-28 MED ORDER — GENERIC EXTERNAL MEDICATION
12.50 | Status: DC
Start: ? — End: 2018-10-28

## 2018-10-28 MED ORDER — NULLO 33.3 MG OR TABS
15.00 | ORAL_TABLET | ORAL | Status: DC
Start: 2018-10-29 — End: 2018-10-28

## 2018-10-28 MED ORDER — DULOXETINE HCL 30 MG PO CPEP
60.00 | ORAL_CAPSULE | ORAL | Status: DC
Start: 2018-10-30 — End: 2018-10-28

## 2018-10-28 MED ORDER — LOVENOX 150 MG/ML ~~LOC~~ SOLN
1.00 | SUBCUTANEOUS | Status: DC
Start: ? — End: 2018-10-28

## 2018-10-28 MED ORDER — CETIRIZINE HCL 10 MG PO TABS
10.00 | ORAL_TABLET | ORAL | Status: DC
Start: 2018-10-30 — End: 2018-10-28

## 2018-10-28 MED ORDER — FLUTICASONE FUROATE-VILANTEROL 200-25 MCG/INH IN AEPB
1.00 | INHALATION_SPRAY | RESPIRATORY_TRACT | Status: DC
Start: 2018-10-30 — End: 2018-10-28

## 2018-10-28 MED ORDER — PREGABALIN 100 MG PO CAPS
300.00 | ORAL_CAPSULE | ORAL | Status: DC
Start: 2018-10-29 — End: 2018-10-28

## 2018-10-28 MED ORDER — GENERIC EXTERNAL MEDICATION
1.00 | Status: DC
Start: 2018-10-30 — End: 2018-10-28

## 2018-10-28 MED ORDER — MONTELUKAST SODIUM 10 MG PO TABS
10.00 | ORAL_TABLET | ORAL | Status: DC
Start: 2018-10-30 — End: 2018-10-28

## 2018-10-28 MED ORDER — DOCUSATE SODIUM 100 MG PO CAPS
100.00 | ORAL_CAPSULE | ORAL | Status: DC
Start: 2018-10-29 — End: 2018-10-28

## 2018-10-28 MED ORDER — BARIJODEEL PO
10.00 | ORAL | Status: DC
Start: ? — End: 2018-10-28

## 2018-10-28 MED ORDER — EQUATE NICOTINE 4 MG MT GUM
4.00 | CHEWING_GUM | OROMUCOSAL | Status: DC
Start: ? — End: 2018-10-28

## 2018-10-28 MED ORDER — SODIUM CHLORIDE FLUSH 0.9 % IV SOLN
10.00 | INTRAVENOUS | Status: DC
Start: 2018-10-29 — End: 2018-10-28

## 2018-10-28 MED ORDER — OXYCODONE HCL 5 MG PO TABS
15.00 | ORAL_TABLET | ORAL | Status: DC
Start: ? — End: 2018-10-28

## 2018-10-28 MED ORDER — DULOXETINE HCL 30 MG PO CPEP
30.00 | ORAL_CAPSULE | ORAL | Status: DC
Start: 2018-10-29 — End: 2018-10-28

## 2018-10-28 MED ORDER — ACETAMINOPHEN 325 MG PO TABS
650.00 | ORAL_TABLET | ORAL | Status: DC
Start: ? — End: 2018-10-28

## 2018-10-28 MED ORDER — ALUMINUM-MAGNESIUM-SIMETHICONE 200-200-20 MG/5ML PO SUSP
30.00 | ORAL | Status: DC
Start: ? — End: 2018-10-28

## 2018-11-01 LAB — CULTURE, BLOOD (ROUTINE X 2)
Culture: NO GROWTH
Culture: NO GROWTH
Special Requests: ADEQUATE
Special Requests: ADEQUATE

## 2019-08-01 ENCOUNTER — Encounter (HOSPITAL_BASED_OUTPATIENT_CLINIC_OR_DEPARTMENT_OTHER): Payer: Self-pay | Admitting: Emergency Medicine

## 2019-08-01 ENCOUNTER — Emergency Department (HOSPITAL_BASED_OUTPATIENT_CLINIC_OR_DEPARTMENT_OTHER): Payer: No Typology Code available for payment source

## 2019-08-01 ENCOUNTER — Other Ambulatory Visit: Payer: Self-pay

## 2019-08-01 ENCOUNTER — Inpatient Hospital Stay (HOSPITAL_BASED_OUTPATIENT_CLINIC_OR_DEPARTMENT_OTHER)
Admission: EM | Admit: 2019-08-01 | Discharge: 2019-08-03 | DRG: 561 | Disposition: A | Discharge status: Home / Self Care | Payer: No Typology Code available for payment source | Attending: Internal Medicine | Admitting: Internal Medicine

## 2019-08-01 DIAGNOSIS — Z981 Arthrodesis status: Secondary | ICD-10-CM

## 2019-08-01 DIAGNOSIS — M4640 Discitis, unspecified, site unspecified: Secondary | ICD-10-CM

## 2019-08-01 DIAGNOSIS — Z6831 Body mass index (BMI) 31.0-31.9, adult: Secondary | ICD-10-CM

## 2019-08-01 DIAGNOSIS — G894 Chronic pain syndrome: Secondary | ICD-10-CM | POA: Diagnosis present

## 2019-08-01 DIAGNOSIS — Z20822 Contact with and (suspected) exposure to covid-19: Secondary | ICD-10-CM | POA: Diagnosis present

## 2019-08-01 DIAGNOSIS — T84038A Mechanical loosening of other internal prosthetic joint, initial encounter: Secondary | ICD-10-CM | POA: Diagnosis not present

## 2019-08-01 DIAGNOSIS — M549 Dorsalgia, unspecified: Secondary | ICD-10-CM | POA: Diagnosis not present

## 2019-08-01 DIAGNOSIS — E669 Obesity, unspecified: Secondary | ICD-10-CM | POA: Diagnosis present

## 2019-08-01 DIAGNOSIS — M5416 Radiculopathy, lumbar region: Secondary | ICD-10-CM | POA: Diagnosis present

## 2019-08-01 DIAGNOSIS — Z79899 Other long term (current) drug therapy: Secondary | ICD-10-CM

## 2019-08-01 DIAGNOSIS — Z881 Allergy status to other antibiotic agents status: Secondary | ICD-10-CM

## 2019-08-01 DIAGNOSIS — J45909 Unspecified asthma, uncomplicated: Secondary | ICD-10-CM | POA: Diagnosis present

## 2019-08-01 DIAGNOSIS — Y834 Other reconstructive surgery as the cause of abnormal reaction of the patient, or of later complication, without mention of misadventure at the time of the procedure: Secondary | ICD-10-CM | POA: Diagnosis present

## 2019-08-01 DIAGNOSIS — Z791 Long term (current) use of non-steroidal anti-inflammatories (NSAID): Secondary | ICD-10-CM

## 2019-08-01 DIAGNOSIS — E66811 Obesity, class 1: Secondary | ICD-10-CM | POA: Diagnosis present

## 2019-08-01 DIAGNOSIS — Y828 Other medical devices associated with adverse incidents: Secondary | ICD-10-CM | POA: Diagnosis present

## 2019-08-01 DIAGNOSIS — Z79891 Long term (current) use of opiate analgesic: Secondary | ICD-10-CM

## 2019-08-01 DIAGNOSIS — Z7951 Long term (current) use of inhaled steroids: Secondary | ICD-10-CM

## 2019-08-01 DIAGNOSIS — M545 Low back pain: Secondary | ICD-10-CM | POA: Diagnosis present

## 2019-08-01 HISTORY — DX: Postlaminectomy syndrome, not elsewhere classified: M96.1

## 2019-08-01 HISTORY — DX: Chronic pain syndrome: G89.4

## 2019-08-01 MED ORDER — IBUPROFEN 400 MG PO TABS
400.0000 mg | ORAL_TABLET | Freq: Once | ORAL | Status: AC
  Administered 2019-08-01: 22:00:00 400 mg via ORAL
  Filled 2019-08-01: qty 1

## 2019-08-01 MED ORDER — HALOPERIDOL LACTATE 5 MG/ML IJ SOLN
5.0000 mg | Freq: Once | INTRAMUSCULAR | Status: AC
  Administered 2019-08-02: 5 mg via INTRAMUSCULAR
  Filled 2019-08-01: qty 1

## 2019-08-01 NOTE — ED Notes (Signed)
Pt states he changed his mind and will take Ibuprofen.

## 2019-08-01 NOTE — ED Notes (Signed)
Pt. Is having lower at L5--S1 and down the R leg.  Pt. Reports he has had multiple surgeries and shavings done with this pain and infection history.  Pt. Has been in severe pain since 1630 today rates pain 10/10.

## 2019-08-01 NOTE — ED Triage Notes (Addendum)
Lower back pain x 5 hours. Hx of chronic pain. He goes to the pain clinic at [facility]. He is planned to have a back stimulator inserted but no scheduled date. He took Oxycodone 3 hours ago. Ibuprofen or Tylenol offered but refused.

## 2019-08-02 ENCOUNTER — Inpatient Hospital Stay (HOSPITAL_COMMUNITY): Payer: No Typology Code available for payment source | Admitting: Registered Nurse

## 2019-08-02 ENCOUNTER — Encounter (HOSPITAL_BASED_OUTPATIENT_CLINIC_OR_DEPARTMENT_OTHER): Payer: Self-pay | Admitting: Emergency Medicine

## 2019-08-02 ENCOUNTER — Encounter (HOSPITAL_COMMUNITY)
Admission: EM | Disposition: A | Discharge status: Home / Self Care | Payer: Self-pay | Source: Home / Self Care | Attending: Internal Medicine

## 2019-08-02 ENCOUNTER — Encounter (HOSPITAL_COMMUNITY): Payer: Self-pay | Admitting: Anesthesiology

## 2019-08-02 ENCOUNTER — Inpatient Hospital Stay (HOSPITAL_COMMUNITY): Payer: No Typology Code available for payment source

## 2019-08-02 DIAGNOSIS — M5441 Lumbago with sciatica, right side: Secondary | ICD-10-CM

## 2019-08-02 DIAGNOSIS — Z79891 Long term (current) use of opiate analgesic: Secondary | ICD-10-CM | POA: Diagnosis not present

## 2019-08-02 DIAGNOSIS — Z791 Long term (current) use of non-steroidal anti-inflammatories (NSAID): Secondary | ICD-10-CM | POA: Diagnosis not present

## 2019-08-02 DIAGNOSIS — G894 Chronic pain syndrome: Secondary | ICD-10-CM | POA: Diagnosis present

## 2019-08-02 DIAGNOSIS — M549 Dorsalgia, unspecified: Secondary | ICD-10-CM | POA: Diagnosis present

## 2019-08-02 DIAGNOSIS — Y828 Other medical devices associated with adverse incidents: Secondary | ICD-10-CM | POA: Diagnosis present

## 2019-08-02 DIAGNOSIS — M545 Low back pain: Secondary | ICD-10-CM | POA: Diagnosis present

## 2019-08-02 DIAGNOSIS — J45909 Unspecified asthma, uncomplicated: Secondary | ICD-10-CM

## 2019-08-02 DIAGNOSIS — Z881 Allergy status to other antibiotic agents status: Secondary | ICD-10-CM | POA: Diagnosis not present

## 2019-08-02 DIAGNOSIS — Z6831 Body mass index (BMI) 31.0-31.9, adult: Secondary | ICD-10-CM | POA: Diagnosis not present

## 2019-08-02 DIAGNOSIS — Y834 Other reconstructive surgery as the cause of abnormal reaction of the patient, or of later complication, without mention of misadventure at the time of the procedure: Secondary | ICD-10-CM | POA: Diagnosis present

## 2019-08-02 DIAGNOSIS — M4640 Discitis, unspecified, site unspecified: Secondary | ICD-10-CM | POA: Diagnosis not present

## 2019-08-02 DIAGNOSIS — T84038A Mechanical loosening of other internal prosthetic joint, initial encounter: Secondary | ICD-10-CM | POA: Diagnosis present

## 2019-08-02 DIAGNOSIS — Z20822 Contact with and (suspected) exposure to covid-19: Secondary | ICD-10-CM | POA: Diagnosis present

## 2019-08-02 DIAGNOSIS — Z79899 Other long term (current) drug therapy: Secondary | ICD-10-CM | POA: Diagnosis not present

## 2019-08-02 DIAGNOSIS — M5416 Radiculopathy, lumbar region: Secondary | ICD-10-CM | POA: Diagnosis present

## 2019-08-02 DIAGNOSIS — Z981 Arthrodesis status: Secondary | ICD-10-CM | POA: Diagnosis not present

## 2019-08-02 DIAGNOSIS — E669 Obesity, unspecified: Secondary | ICD-10-CM

## 2019-08-02 DIAGNOSIS — Z7951 Long term (current) use of inhaled steroids: Secondary | ICD-10-CM | POA: Diagnosis not present

## 2019-08-02 HISTORY — PX: RADIOLOGY WITH ANESTHESIA: SHX6223

## 2019-08-02 LAB — CBC WITH DIFFERENTIAL/PLATELET
Abs Immature Granulocytes: 0.03 10*3/uL (ref 0.00–0.07)
Basophils Absolute: 0 10*3/uL (ref 0.0–0.1)
Basophils Relative: 0 %
Eosinophils Absolute: 0.2 10*3/uL (ref 0.0–0.5)
Eosinophils Relative: 2 %
HCT: 43 % (ref 39.0–52.0)
Hemoglobin: 13.3 g/dL (ref 13.0–17.0)
Immature Granulocytes: 0 %
Lymphocytes Relative: 21 %
Lymphs Abs: 1.9 10*3/uL (ref 0.7–4.0)
MCH: 23.8 pg — ABNORMAL LOW (ref 26.0–34.0)
MCHC: 30.9 g/dL (ref 30.0–36.0)
MCV: 76.9 fL — ABNORMAL LOW (ref 80.0–100.0)
Monocytes Absolute: 0.8 10*3/uL (ref 0.1–1.0)
Monocytes Relative: 9 %
Neutro Abs: 6 10*3/uL (ref 1.7–7.7)
Neutrophils Relative %: 68 %
Platelets: 306 10*3/uL (ref 150–400)
RBC: 5.59 MIL/uL (ref 4.22–5.81)
RDW: 14.4 % (ref 11.5–15.5)
WBC: 8.9 10*3/uL (ref 4.0–10.5)
nRBC: 0 % (ref 0.0–0.2)

## 2019-08-02 LAB — BASIC METABOLIC PANEL
Anion gap: 12 (ref 5–15)
BUN: 14 mg/dL (ref 6–20)
CO2: 23 mmol/L (ref 22–32)
Calcium: 9.1 mg/dL (ref 8.9–10.3)
Chloride: 102 mmol/L (ref 98–111)
Creatinine, Ser: 1.04 mg/dL (ref 0.61–1.24)
GFR calc Af Amer: >60 mL/min (ref >60)
GFR calc non Af Amer: >60 mL/min (ref >60)
Glucose, Bld: 107 mg/dL — ABNORMAL HIGH (ref 70–99)
Potassium: 3.7 mmol/L (ref 3.5–5.1)
Sodium: 137 mmol/L (ref 135–145)

## 2019-08-02 LAB — SARS CORONAVIRUS 2 AG (30 MIN TAT): SARS Coronavirus 2 Ag: NEGATIVE

## 2019-08-02 LAB — URINALYSIS, ROUTINE W REFLEX MICROSCOPIC
Bilirubin Urine: NEGATIVE
Glucose, UA: NEGATIVE mg/dL
Hgb urine dipstick: NEGATIVE
Ketones, ur: NEGATIVE mg/dL
Leukocytes,Ua: NEGATIVE
Nitrite: NEGATIVE
Protein, ur: NEGATIVE mg/dL
Specific Gravity, Urine: 1.025 (ref 1.005–1.030)
pH: 6 (ref 5.0–8.0)

## 2019-08-02 LAB — C-REACTIVE PROTEIN: CRP: 5 mg/dL — ABNORMAL HIGH (ref <1.0)

## 2019-08-02 LAB — HIV ANTIBODY (ROUTINE TESTING W REFLEX): HIV Screen 4th Generation wRfx: NONREACTIVE

## 2019-08-02 LAB — SEDIMENTATION RATE: Sed Rate: 31 mm/hr — ABNORMAL HIGH (ref 0–16)

## 2019-08-02 SURGERY — MRI WITH ANESTHESIA
Anesthesia: Monitor Anesthesia Care

## 2019-08-02 MED ORDER — HEPARIN SODIUM (PORCINE) 5000 UNIT/ML IJ SOLN
5000.0000 [IU] | Freq: Three times a day (TID) | INTRAMUSCULAR | Status: DC
  Administered 2019-08-02: 5000 [IU] via SUBCUTANEOUS
  Filled 2019-08-02: qty 1

## 2019-08-02 MED ORDER — LACTATED RINGERS IV SOLN: INTRAVENOUS | Status: DC

## 2019-08-02 MED ORDER — GADOBUTROL 1 MMOL/ML IV SOLN
10.0000 mL | Freq: Once | INTRAVENOUS | Status: AC | PRN
  Administered 2019-08-02: 13:00:00 10 mL via INTRAVENOUS

## 2019-08-02 MED ORDER — TIZANIDINE HCL 4 MG PO TABS: 8.0000 mg | ORAL_TABLET | Freq: Every evening | ORAL | Status: DC | PRN

## 2019-08-02 MED ORDER — MONTELUKAST SODIUM 10 MG PO TABS: 10.0000 mg | ORAL_TABLET | Freq: Every day | ORAL | Status: DC

## 2019-08-02 MED ORDER — PREGABALIN 75 MG PO CAPS
150.0000 mg | ORAL_CAPSULE | Freq: Every day | ORAL | Status: DC
  Administered 2019-08-02 – 2019-08-03 (×2): 150 mg via ORAL
  Filled 2019-08-02 (×2): qty 2

## 2019-08-02 MED ORDER — MEPERIDINE HCL 25 MG/ML IJ SOLN: 6.2500 mg | INTRAMUSCULAR | Status: DC | PRN

## 2019-08-02 MED ORDER — ALBUTEROL SULFATE (2.5 MG/3ML) 0.083% IN NEBU: 2.5000 mg | INHALATION_SOLUTION | Freq: Four times a day (QID) | RESPIRATORY_TRACT | Status: DC | PRN

## 2019-08-02 MED ORDER — OXYCODONE HCL 5 MG PO TABS
10.0000 mg | ORAL_TABLET | Freq: Four times a day (QID) | ORAL | Status: DC
  Administered 2019-08-02 – 2019-08-03 (×4): 10 mg via ORAL
  Filled 2019-08-02 (×4): qty 2

## 2019-08-02 MED ORDER — LORATADINE 10 MG PO TABS
10.0000 mg | ORAL_TABLET | Freq: Every day | ORAL | Status: DC
  Filled 2019-08-02: qty 1

## 2019-08-02 MED ORDER — BUPRENORPHINE HCL-NALOXONE HCL 2-0.5 MG SL SUBL
1.0000 | SUBLINGUAL_TABLET | Freq: Every day | SUBLINGUAL | Status: DC
  Administered 2019-08-02 – 2019-08-03 (×2): 1 via SUBLINGUAL
  Filled 2019-08-02 (×2): qty 1

## 2019-08-02 MED ORDER — VANCOMYCIN HCL IN DEXTROSE 1-5 GM/200ML-% IV SOLN
1000.0000 mg | Freq: Once | INTRAVENOUS | Status: AC
  Administered 2019-08-02: 02:00:00 1000 mg via INTRAVENOUS
  Filled 2019-08-02: qty 200

## 2019-08-02 MED ORDER — PIPERACILLIN-TAZOBACTAM 3.375 G IVPB 30 MIN
3.3750 g | Freq: Once | INTRAVENOUS | Status: AC
  Administered 2019-08-02: 01:00:00 3.375 g via INTRAVENOUS
  Filled 2019-08-02 (×2): qty 50

## 2019-08-02 MED ORDER — DOCUSATE SODIUM 100 MG PO CAPS
100.0000 mg | ORAL_CAPSULE | Freq: Two times a day (BID) | ORAL | Status: DC
  Administered 2019-08-02 (×2): 100 mg via ORAL
  Filled 2019-08-02 (×3): qty 1

## 2019-08-02 MED ORDER — DULOXETINE HCL 60 MG PO CPEP: 60.0000 mg | ORAL_CAPSULE | Freq: Two times a day (BID) | ORAL | Status: DC

## 2019-08-02 MED ORDER — ACETAMINOPHEN 10 MG/ML IV SOLN: 1000.0000 mg | Freq: Once | INTRAVENOUS | Status: DC | PRN

## 2019-08-02 MED ORDER — ACETAMINOPHEN 325 MG PO TABS: 650.0000 mg | ORAL_TABLET | Freq: Four times a day (QID) | ORAL | Status: DC | PRN

## 2019-08-02 MED ORDER — KETOROLAC TROMETHAMINE 30 MG/ML IJ SOLN
30.0000 mg | Freq: Once | INTRAMUSCULAR | Status: AC
  Administered 2019-08-02: 01:00:00 30 mg via INTRAVENOUS

## 2019-08-02 MED ORDER — MOMETASONE FURO-FORMOTEROL FUM 200-5 MCG/ACT IN AERO: 2.0000 | INHALATION_SPRAY | Freq: Two times a day (BID) | RESPIRATORY_TRACT | Status: DC

## 2019-08-02 MED ORDER — OXYCODONE-ACETAMINOPHEN 5-325 MG PO TABS: 1.0000 | ORAL_TABLET | Freq: Four times a day (QID) | ORAL | Status: DC | PRN

## 2019-08-02 MED ORDER — FENTANYL CITRATE (PF) 100 MCG/2ML IJ SOLN
50.0000 ug | Freq: Once | INTRAMUSCULAR | Status: AC
  Administered 2019-08-02: 01:00:00 50 ug via INTRAVENOUS
  Filled 2019-08-02: qty 2

## 2019-08-02 MED ORDER — ONDANSETRON HCL 4 MG/2ML IJ SOLN: 4.0000 mg | Freq: Four times a day (QID) | INTRAMUSCULAR | Status: DC | PRN

## 2019-08-02 MED ORDER — ONDANSETRON HCL 4 MG PO TABS: 4.0000 mg | ORAL_TABLET | Freq: Four times a day (QID) | ORAL | Status: DC | PRN

## 2019-08-02 MED ORDER — ACETAMINOPHEN 325 MG PO TABS: 325.0000 mg | ORAL_TABLET | Freq: Once | ORAL | Status: DC | PRN

## 2019-08-02 MED ORDER — PROMETHAZINE HCL 25 MG/ML IJ SOLN: 6.2500 mg | INTRAMUSCULAR | Status: DC | PRN

## 2019-08-02 MED ORDER — ACETAMINOPHEN 650 MG RE SUPP: 650.0000 mg | Freq: Four times a day (QID) | RECTAL | Status: DC | PRN

## 2019-08-02 MED ORDER — ACETAMINOPHEN 160 MG/5ML PO SOLN: 325.0000 mg | Freq: Once | ORAL | Status: DC | PRN

## 2019-08-02 MED ORDER — CEPHALEXIN 500 MG PO CAPS: 500.0000 mg | ORAL_CAPSULE | Freq: Two times a day (BID) | ORAL | Status: DC

## 2019-08-02 MED ORDER — DULOXETINE HCL 60 MG PO CPEP
60.0000 mg | ORAL_CAPSULE | Freq: Every day | ORAL | Status: DC
  Administered 2019-08-03: 10:00:00 60 mg via ORAL
  Filled 2019-08-02 (×2): qty 1

## 2019-08-02 MED ORDER — ALBUTEROL SULFATE HFA 108 (90 BASE) MCG/ACT IN AERS: 2.0000 | INHALATION_SPRAY | Freq: Four times a day (QID) | RESPIRATORY_TRACT | Status: DC | PRN

## 2019-08-02 MED ORDER — METHOCARBAMOL 500 MG PO TABS: 500.0000 mg | ORAL_TABLET | Freq: Four times a day (QID) | ORAL | Status: DC | PRN

## 2019-08-02 MED ORDER — KETOROLAC TROMETHAMINE 30 MG/ML IJ SOLN
30.0000 mg | Freq: Once | INTRAMUSCULAR | Status: DC
  Filled 2019-08-02: qty 1

## 2019-08-02 MED ORDER — MORPHINE SULFATE ER 15 MG PO TBCR: 15.0000 mg | EXTENDED_RELEASE_TABLET | Freq: Two times a day (BID) | ORAL | Status: DC

## 2019-08-02 NOTE — ED Notes (Signed)
Pt. Unable to urinate at present time he reports

## 2019-08-02 NOTE — Plan of Care (Signed)
  Problem: Education: Goal: Knowledge of General Education information will improve Description: Including pain rating scale, medication(s)/side effects and non-pharmacologic comfort measures Outcome: Progressing   Problem: Clinical Measurements: Goal: Respiratory complications will improve Outcome: Progressing Note: On room air   Problem: Activity: Goal: Risk for activity intolerance will decrease Outcome: Progressing Note: Up independently in room , tolerating well   Problem: Pain Managment: Goal: General experience of comfort will improve Outcome: Progressing Note: Chronic back pain, treated with scheduled oxycodone   Problem: Safety: Goal: Ability to remain free from injury will improve Outcome: Progressing   Problem: Nutrition: Goal: Adequate nutrition will be maintained Outcome: Completed/Met   Problem: Coping: Goal: Level of anxiety will decrease Outcome: Completed/Met   Problem: Elimination: Goal: Will not experience complications related to urinary retention Outcome: Completed/Met

## 2019-08-02 NOTE — H&P (Signed)
History and Physical    Isaiah Gentry YNW:[id number] DOB: 09-26-1975 DOA: 08/01/2019  PCP: Reubin Milan, MD Consultants:  [place] [facility] - Pain clinic, [place] - spine surgeon Patient coming from:  Home - lives with wife and 2 kids; NOK: Wife, Niguel Moure, [phone]  Chief Complaint: Back pain  HPI: Isaiah Gentry is a 44 y.o. male with medical history significant of chronic back pain with failed back surgery presenting with back pain.  He had 2 spinal fusions in the last year and they didn't go well.  For the last couple of weeks, he has been having back pain.  He is waiting on a spinal cord stimulator in February.  He had a fare last night and went to [facility].  He last had fevers a month or two ago.  When this started in April, he had an MRI and there was concern for infection from the first fusion; instead it seemed to be hardware issue instead.  He is unable to lie flat and so he got conscious sedation for his last MRI.  He has chronic right-sided radiculopathy.  No saddle anesthesia, no loss of bowel or bladder.   ED Course:  Carryover, per Dr. Marlowe Sax:  Patient with history of prior back surgeries with hardware placed and history of prior discitis in April 2019, chronic back pain on narcotics at home presenting with complaints of worsening lower back pain. Patient had an MRI done at [facility] on 11/4 which showed hardware malfunction/loosening of screws. Repeat CT done in the ED again showing loosening of screws which radiologist feels is worse than the MRI done in November. Also showing soft tissue stranding concerning for discitis. Covid test negative. Temperature 99.5. Tachycardic on arrival, now improved after pain control. No leukocytosis. ESR and CRP pending. Started on antibiotics including vancomycin and Zosyn. Patient able to walk in the ED. ED provider discussed the case with surgeon Dr. Teena Dunk who had done the patient's previous back surgery. ED  provider reported that this physician is affiliated with [facility] but does not have admitting privileges currently. Surgeon felt that the hardware malfunction seen on CT is stable compared to MRI done in November. He feels the patient's back pain is more related to an infection. Patient will need a lumbar MRI but Dr. Teena Dunk who knows the patient is concerned that the patient will not be able to tolerate an MRI without general anesthesia.   Review of Systems: As per HPI; otherwise review of systems reviewed and negative.   Ambulatory Status:  Ambulates without assistance; uses a cane when it feels like his leg might give out  Past Medical History:  Diagnosis Date  . Asthma   . Back pain   . Chronic pain syndrome   . DDD (degenerative disc disease), thoracic   . Diverticulitis   . Failed back surgical syndrome   . Migraine   . Osteoarthritis of lumbar spine     Past Surgical History:  Procedure Laterality Date  . BACK SURGERY    . BUNIONECTOMY    . ELBOW SURGERY      Social History   Socioeconomic History  . Marital status: Married    Spouse name: Not on file  . Number of children: Not on file  . Years of education: Not on file  . Highest education level: Not on file  Occupational History  . Not on file  Tobacco Use  . Smoking status: Never Smoker  . Smokeless tobacco: Never Used  Substance  and Sexual Activity  . Alcohol use: No  . Drug use: No  . Sexual activity: Not on file  Other Topics Concern  . Not on file  Social History Narrative  . Not on file   Social Determinants of Health   Financial Resource Strain:   . Difficulty of Paying Living Expenses: Not on file  Food Insecurity:   . Worried About Charity fundraiser in the Last Year: Not on file  . Ran Out of Food in the Last Year: Not on file  Transportation Needs:   . Lack of Transportation (Medical): Not on file  . Lack of Transportation (Non-Medical): Not on file  Physical Activity:   . Days of  Exercise per Week: Not on file  . Minutes of Exercise per Session: Not on file  Stress:   . Feeling of Stress : Not on file  Social Connections:   . Frequency of Communication with Friends and Family: Not on file  . Frequency of Social Gatherings with Friends and Family: Not on file  . Attends Religious Services: Not on file  . Active Member of Clubs or Organizations: Not on file  . Attends Archivist Meetings: Not on file  . Marital Status: Not on file  Intimate Partner Violence:   . Fear of Current or Ex-Partner: Not on file  . Emotionally Abused: Not on file  . Physically Abused: Not on file  . Sexually Abused: Not on file    Allergies  Allergen Reactions  . Erythromycin Shortness Of Breath  . Anthrax Vaccine Swelling    Joints lock up and run fever    History reviewed. No pertinent family history.  Prior to Admission medications   Medication Sig Start Date End Date Taking? Authorizing Provider  albuterol (PROVENTIL HFA;VENTOLIN HFA) 108 (90 Base) MCG/ACT inhaler Inhale 2 puffs into the lungs every 6 (six) hours as needed for wheezing or shortness of breath.   Yes [provider]  buprenorphine (SUBUTEX) 2 MG SUBL SL tablet Place under the tongue. 07/20/19  Yes [provider]  DULoxetine (CYMBALTA) 60 MG capsule Take 60 mg by mouth 2 (two) times daily.    Yes [provider]  oxyCODONE-acetaminophen (PERCOCET) 5-325 MG tablet Take 1-2 tablets by mouth every 6 (six) hours as needed. 03/04/18  Yes Delo, Nathaneil Canary, MD  pregabalin (LYRICA) 150 MG capsule Take 150 mg by mouth.   Yes [provider]  budesonide-formoterol (SYMBICORT) 160-4.5 MCG/ACT inhaler Inhale 2 puffs into the lungs 2 (two) times daily.    [provider]  cephALEXin (KEFLEX) 500 MG capsule Take 500 mg by mouth 2 (two) times daily.    [provider]  cetirizine (ZYRTEC) 10 MG tablet Take 10 mg by mouth daily.    [provider]  meloxicam  (MOBIC) 7.5 MG tablet Take 7.5 mg by mouth daily.    [provider]  methocarbamol (ROBAXIN) 500 MG tablet Take 500 mg by mouth every 6 (six) hours as needed for muscle spasms.    [provider]  montelukast (SINGULAIR) 10 MG tablet Take 10 mg by mouth daily.    [provider]  morphine (MS CONTIN) 15 MG 12 hr tablet Take 15 mg by mouth every 12 (twelve) hours.    [provider]  Oxycodone HCl 10 MG TABS Take by mouth. 08/04/19   [provider]  tiZANidine (ZANAFLEX) 4 MG tablet Take 8 mg by mouth at bedtime as needed for muscle spasms.  [provider]    Physical Exam: Vitals:   08/02/19 0930 08/02/19 1142 08/02/19 1157 08/02/19 1221  BP:  (!) 105/58 97/64 111/70  Pulse:  72 67 65  Resp:  _0 Temp:  (!) 97 F (36.1 C)  (!) 97.3 F (36.3 C)  TempSrc:    Oral  SpO2:  96% 96% 100%  Weight: 106.6 kg     Height: 6' 0.01" (1.829 m)        . General:  Appears calm and comfortable and is NAD . Eyes:  PERRL, EOMI, normal lids, iris . ENT:  grossly normal hearing, lips & tongue, mmm; appropriate dentition . Neck:  no LAD, masses or thyromegaly . Cardiovascular:  RRR, no m/r/g. No LE edema.  Marland Kitchen Respiratory:   CTA bilaterally with no wheezes/rales/rhonchi.  Normal respiratory effort. . Abdomen:  soft, NT, ND, NABS . Skin:  no rash or induration seen on limited exam . Musculoskeletal:  grossly normal tone BUE/BLE, good ROM, no bony abnormality . Psychiatric:  grossly normal mood and affect, speech fluent and appropriate, AOx3 . Neurologic:  CN 2-12 grossly intact, moves all extremities in coordinated fashion    Radiological Exams on Admission: CT Lumbar Spine Wo Contrast  Result Date: 08/01/2019 CLINICAL DATA:  Back pain EXAM: CT LUMBAR SPINE WITHOUT CONTRAST TECHNIQUE: Multidetector CT imaging of the lumbar spine was performed without intravenous contrast administration. Multiplanar CT image reconstructions were also  generated. COMPARISON:  October 23, 2018 FINDINGS: Segmentation: 5 lumbar type vertebrae. Alignment: Normal. Vertebrae: The patient is status post prior L5-S1 posterior fusion. Paraspinal and other soft tissues: There is some mild fat stranding anterior to the L5-S1 disc space. Disc levels: There is severe disc height loss at the L5-S1. The previously placed L5-S1 interbody spacer has been displaced inferolaterally to the right and is now malposition. There is some loosening of the pedicle screws through the sacrum, most notably on the right. IMPRESSION: 1. No acute displaced fracture. 2. Status post L5-S1 posterior fusion. Again noted is inferolateral migration of the L5-S1 interbody spacer as seen on multiple prior studies. 3. There is probable hardware loosening of the bilateral sacral pedicle screws, right worse than left. 4. Mild fat stranding anterior to the L5-S1 disc space of unknown clinical significance. Correlation with laboratory studies and inflammatory markers is recommended. If there is clinical concern for discitis osteomyelitis, follow-up with contrast enhanced MRI is recommended. Electronically Signed   By: Constance Holster M.D.   On: 08/01/2019 23:55   MR Lumbar Spine W Wo Contrast  Result Date: 08/02/2019 CLINICAL DATA:  Low back pain. History of lumbar surgery. History of diskitis/osteomyelitis at L5-S1 EXAM: MRI LUMBAR SPINE WITHOUT AND WITH CONTRAST TECHNIQUE: Multiplanar and multiecho pulse sequences of the lumbar spine were obtained without and with intravenous contrast. CONTRAST:  47m GADAVIST GADOBUTROL 1 MMOL/ML IV SOLN COMPARISON:  Multiple prior examinations. CT lumbar spine from yesterday and MRI lumbar spine 05/25/2019. FINDINGS: Segmentation: There are five lumbar type vertebral bodies. The last full intervertebral disc space is labeled L5-S1. Alignment:  Normal Vertebrae: No significant signal abnormality or enhancement involving L5 or S1 to suggest osteomyelitis. Probable  enhancing granulation tissue in the disc space at L5-S1 but no findings suspicious for discitis. Conus medullaris and cauda equina: Conus extends to the bottom of T12 level. Conus and cauda equina appear normal. Paraspinal and other soft tissues: No significant paraspinal or retroperitoneal findings. Stable extruded interbody fusion device at L5-S1 anteriorly on the right side.  Disc levels: L1-2: No significant findings. L2-3: No significant findings.  Mild facet disease. L3-4: Mild to moderate facet disease but no disc protrusions, spinal or foraminal stenosis. Mild lateral recess encroachment bilaterally. L4-5: No disc protrusions, spinal or foraminal stenosis. Moderate facet disease. L5-S1: Stable postoperative changes. No MR findings suspicious for recurrent discitis/osteomyelitis. IMPRESSION: 1. Stable MR appearance of the lumbar spine when compared to the most recent study from 05/25/2019. I do not see any findings suspicious for recurrent discitis or osteomyelitis at L5-S1. 2. Stable postoperative changes at L5-S1 with the extruded interbody fusion device anteriorly on the right side. 3. The other intervertebral disc spaces are unremarkable and unchanged. Electronically Signed   By: Marijo Sanes M.D.   On: 08/02/2019 13:04    EKG: not done   Labs on Admission: I have personally reviewed the available labs and imaging studies at the time of the admission.  Pertinent labs:   Glucose 107 Unremarkable CBC ESR 31 COVID negative UA unremarkable Blood cultures pending    Assessment/Plan Principal Problem:   Back pain Active Problems:   Chronic pain syndrome   Asthma in adult without complication   Obesity (BMI 30.0-34.9)   Back pain -Patient with prior h/o failed lumbar stenosis surgery x 2 who has chronic back pain (see below) presenting with acute worsening -CT showed concern for diskitis/osteo vs. Hardware malfunction -Patient was transferred overnight from [facility] for MRI under  anesthesia (cannot lie flat) -MRI performed and appears to show chronic issues without apparent acute worsening or infection -Neurosurgery consulted to ensure that no acute intervention is needed -He is scheduled for spinal cord stimulator soon -No current evidence of sepsis or concern for infectious etiology -F/u blood cultures at 24 hours  Chronic pain syndrome -Resume home medications - Buprenorphine, Oxycodone, Lyrica, Cymbalta -Assuming pain flare is improved tomorrow AM, anticipate d/c to home 1/13  Asthma -Continue Zyrtec (Claritin formulary substitution) and Albuterol HFA prn  Obesity -BMI 31.9 -Weight loss should be encouraged -Outpatient PCP/bariatric medicine/bariatric surgery f/u encouraged     Note: This patient has been tested and is negative for the novel coronavirus COVID-19.  DVT prophylaxis: Heparin Code Status:  Full  Family Communication: None present; I spoke with his wife by telephone  Disposition Plan:  Home once clinically improved Consults called: Neurosurgery  Admission status: Admit - It is my clinical opinion that admission to INPATIENT is reasonable and necessary because of the expectation that this patient will require hospital care that crosses at least 2 midnights to treat this condition based on the medical complexity of the problems presented.  Given the aforementioned information, the predictability of an adverse outcome is felt to be significant.    Karmen Bongo MD [facility]   How to contact the [facility] Attending or Consulting provider [place] or covering provider during after hours [place], for this patient?  1. Check the care team in [facility] and look for a) attending/consulting [facility] provider listed and b) the [facility] team listed 2. Log into [email or web address] and use [place]'s universal password to access. If you do not have the password, please contact the hospital operator. 3. Locate the [facility] provider you are looking for under [facility] and page to a number that you can be directly reached. 4. If you still have difficulty reaching the provider, please page the [facility] (Director on Call) for the Hospitalists listed on amion for assistance.   08/02/2019, 2:02 PM

## 2019-08-02 NOTE — ED Notes (Signed)
Called [facility] on call [phone] 

## 2019-08-02 NOTE — Consult Note (Signed)
Neurosurgery Consultation  Reason for Consult: Possible post-op discitis Referring Physician: Lorin Mercy  CC: Low back pain  HPI: [name] He has baseline back pain that requires the use of opiates, but starting roughly 5 days ago he had a significant increase in his axial back pain. He has some baseline proximal RLE pain without numbness or paresthesias, these symptoms are unchanged. No new weakness, numbness, or parasthesias, no recent change in bowel or bladder function. [name], severe in intensity, worse with activity, [name] He was taking doxycycline for what sounds like suppressive therapy until 2 weeks ago, he reports that his ESR and CRP have been persistently elevated since his post-op discitis and has never really normalized.  ROS: A 14 point ROS was performed and is negative except as noted in the HPI.   PMHx:  Past Medical History:  Diagnosis Date  . Asthma   . Back pain   . Chronic pain syndrome   . DDD (degenerative disc disease), thoracic   . Diverticulitis   . Failed back surgical syndrome   . Migraine   . Osteoarthritis of lumbar spine    FamHx: History reviewed. No pertinent family history. SocHx:  reports that he has never smoked. He has never used smokeless tobacco. He reports that he does not drink alcohol or use drugs.  Exam: Vital signs in last 24 hours: Temp:  [97 F (36.1 C)-99.5 F (37.5 C)] 97.3 F (36.3 C) (01/12 1221) Pulse Rate:  [60-111] 65 (01/12 1221) Resp:  [12-25] 17 (01/12 1221) BP: (92-132)/(56-94) 111/70 (01/12 1221) SpO2:  [96 %-100 %] 100 % (01/12 1221) Weight:  [106.6 kg] 106.6 kg (01/12 0930) General: Awake, alert, cooperative, lying in bed in  NAD Head: normocephalic and atruamatic HEENT: neck supple Pulmonary: breathing room air comfortably, no evidence of increased work of breathing Cardiac: RRR Abdomen: S NT ND Extremities: warm and well perfused x4 Neuro: AOx3, PERRL, EOMI, FS Strength 5/5 x4, SILTx4, no drift   Assessment and Plan: 44 y.o. man with acute on chronic back pain in the setting of prior post-op L5-S1 discitis. [name], [name], [name] Operative reports are not visible to me at this time, but intra-op fluoro and post-op images appear to show this. I personally reviewed today's CT L-spine, which shows what appears to be partial fusion across the disc space, on the sagittal recons there is pretty clear bridging of bone around the OLIF graft. The S1 screws are clearly halo'd, as they have been previously, but he did have discitis that looks like it has healed somewhat on CT. MRI L-spine reviewed, it shows some continued inflammatory changes with enhancement in the L5-S1 disc space, hard to assess their sterility at this point, given the complicated history. The patient reports that this feels like his normal breakthrough back pain. When he had known discitis, his pain was worse and he felt systemically ill and toxic, which he does not feel now.   -no acute neurosurgical  intervention indicated at this time -Discussed the above with the patient, difficult to know for sure regarding the sterility at L5-S1, but uncommon to have recurrent discitis after completed treatment. On the CT, the endplates are healing, which provides some evidence that the infection has improved since his last CT. [name] From a surgical perspective,  no acute intervention indicated, he should follow up with his established spine surgeon and ID physician. If he developed systemic symptoms then IR could likely do a CT guided biopsy of the disc space where it is unfused, but I would defer to them if this is technically possible.  -please call with any concerns or questions  Judith Part, MD 08/02/19 1:21 PM [facility]

## 2019-08-02 NOTE — ED Provider Notes (Signed)
[facility] Provider Note   CSN: [id number] Arrival date & time: 08/01/19  2116     History Chief Complaint  Patient presents with  . Back Pain    Isaiah Gentry is a 44 y.o. male.  The history is provided by the patient.  Back Pain Location:  Sacro-iliac joint and lumbar spine Quality:  Stabbing Radiates to:  Does not radiate Pain severity:  Severe Pain is:  Same all the time Onset quality:  Sudden Duration:  5 hours Timing:  Constant Progression:  Unchanged Chronicity:  Recurrent (acute on chronic, sees pain management for back pain ) Context: not emotional stress, not falling, not jumping from heights, not lifting heavy objects, not MCA, not MVA, not occupational injury, not pedestrian accident, not physical stress, not recent illness, not recent injury and not twisting   Relieved by:  Nothing Worsened by:  Nothing Ineffective treatments:  Narcotics Associated symptoms: no abdominal pain, no abdominal swelling, no bladder incontinence, no bowel incontinence, no chest pain, no dysuria, no fever, no headaches, no numbness, no paresthesias, no pelvic pain, no perianal numbness, no tingling, no weakness and no weight loss   Risk factors: no hx of cancer   Patient with h/o multiple back surgeries, loosening of the hardware on MRI 11/20202, h/o diskitis and chronic pain on multiple narcotics presents with an exacebation.  No new weakness nor numbness. No covid exposures or covid symptoms.  No n/v/d.      Past Medical History:  Diagnosis Date  . Asthma   . Back pain   . Chronic pain syndrome   . DDD (degenerative disc disease), thoracic   . Diverticulitis   . Failed back surgical syndrome   . Migraine   . Osteoarthritis of lumbar spine     There are no problems to display for this patient.   Past Surgical History:  Procedure Laterality Date  . BACK SURGERY    . BUNIONECTOMY    . ELBOW SURGERY         History reviewed. No  pertinent family history.  Social History   Tobacco Use  . Smoking status: Never Smoker  . Smokeless tobacco: Never Used  Substance Use Topics  . Alcohol use: No  . Drug use: No    Home Medications Prior to Admission medications   Medication Sig Start Date End Date Taking? Authorizing Provider  albuterol (PROVENTIL HFA;VENTOLIN HFA) 108 (90 Base) MCG/ACT inhaler Inhale 2 puffs into the lungs every 6 (six) hours as needed for wheezing or shortness of breath.   Yes [provider]  buprenorphine (SUBUTEX) 2 MG SUBL SL tablet Place under the tongue. 07/20/19  Yes [provider]  DULoxetine (CYMBALTA) 60 MG capsule Take 60 mg by mouth 2 (two) times daily.    Yes [provider]  oxyCODONE-acetaminophen (PERCOCET) 5-325 MG tablet Take 1-2 tablets by mouth every 6 (six) hours as needed. 03/04/18  Yes Delo, Nathaneil Canary, MD  pregabalin (LYRICA) 150 MG capsule Take 150 mg by mouth.   Yes [provider]  budesonide-formoterol (SYMBICORT) 160-4.5 MCG/ACT inhaler Inhale 2 puffs into the lungs 2 (two) times daily.    [provider]  cephALEXin (KEFLEX) 500 MG capsule Take 500 mg by mouth 2 (two) times daily.    [provider]  cetirizine (ZYRTEC) 10 MG tablet Take 10 mg by mouth daily.    [provider]  meloxicam (MOBIC) 7.5 MG tablet Take 7.5 mg by mouth daily.  [provider]  methocarbamol (ROBAXIN) 500 MG tablet Take 500 mg by mouth every 6 (six) hours as needed for muscle spasms.    [provider]  montelukast (SINGULAIR) 10 MG tablet Take 10 mg by mouth daily.    [provider]  morphine (MS CONTIN) 15 MG 12 hr tablet Take 15 mg by mouth every 12 (twelve) hours.    [provider]  Oxycodone HCl 10 MG TABS Take by mouth. 08/04/19   [provider]  tiZANidine (ZANAFLEX) 4 MG tablet Take 8 mg by mouth at bedtime as needed for muscle spasms.     [provider]    Allergies     Erythromycin  Review of Systems   Review of Systems  Constitutional: Negative for fever and weight loss.  HENT: Negative for congestion.   Eyes: Negative for visual disturbance.  Respiratory: Negative for cough and shortness of breath.   Cardiovascular: Negative for chest pain.  Gastrointestinal: Negative for abdominal pain and bowel incontinence.  Genitourinary: Negative for bladder incontinence, dysuria and pelvic pain.  Musculoskeletal: Positive for back pain.  Skin: Negative for wound.  Neurological: Negative for tingling, weakness, numbness, headaches and paresthesias.  Psychiatric/Behavioral: Negative for agitation.  All other systems reviewed and are negative.   Physical Exam Updated Vital Signs BP 108/80   Pulse 87   Temp 99.5 F (37.5 C) (Oral)   Resp 15   Ht 6' (1.829 m)   Wt 106.6 kg   SpO2 100%   BMI 31.87 kg/m   Physical Exam Vitals and nursing note reviewed.  Constitutional:      Appearance: He is obese. He is not diaphoretic.  HENT:     Head: Normocephalic and atraumatic.     Nose: Nose normal.  Eyes:     Conjunctiva/sclera: Conjunctivae normal.     Pupils: Pupils are equal, round, and reactive to light.  Cardiovascular:     Rate and Rhythm: Normal rate and regular rhythm.     Pulses: Normal pulses.     Heart sounds: Normal heart sounds.  Pulmonary:     Effort: Pulmonary effort is normal.     Breath sounds: Normal breath sounds.  Abdominal:     General: Abdomen is flat. Bowel sounds are normal.     Tenderness: There is no abdominal tenderness. There is no guarding or rebound.  Musculoskeletal:        General: Normal range of motion.     Cervical back: Normal, normal range of motion and neck supple.     Thoracic back: Normal.     Lumbar back: Normal.     Right knee: Normal.     Left knee: Normal.     Right lower leg: Normal.     Left lower leg: Normal.     Right ankle: Normal.     Right Achilles Tendon: Normal.     Left ankle: Normal.      Left Achilles Tendon: Normal.  Skin:    General: Skin is warm and dry.     Capillary Refill: Capillary refill takes less than 2 seconds.  Neurological:     General: No focal deficit present.     Mental Status: He is alert and oriented to person, place, and time.     Deep Tendon Reflexes: Reflexes normal.  Psychiatric:        Mood and Affect: Mood normal.        Behavior: Behavior normal.     ED Results /  Procedures / Treatments   Labs (all labs ordered are listed, but only abnormal results are displayed) Results for orders placed or performed during the hospital encounter of 08/01/19  SARS Coronavirus 2 Ag (30 min TAT) - Nasal Swab (BD Veritor Kit)   Specimen: Nasal Swab (BD Veritor Kit)  Result Value Ref Range   SARS Coronavirus 2 Ag NEGATIVE NEGATIVE  CBC with Differential  Result Value Ref Range   WBC 8.9 4.0 - 10.5 K/uL   RBC 5.59 4.22 - 5.81 MIL/uL   Hemoglobin 13.3 13.0 - 17.0 g/dL   HCT 43.0 39.0 - 52.0 %   MCV 76.9 (L) 80.0 - 100.0 fL   MCH 23.8 (L) 26.0 - 34.0 pg   MCHC 30.9 30.0 - 36.0 g/dL   RDW 14.4 11.5 - 15.5 %   Platelets 306 150 - 400 K/uL   nRBC 0.0 0.0 - 0.2 %   Neutrophils Relative % 68 %   Neutro Abs 6.0 1.7 - 7.7 K/uL   Lymphocytes Relative 21 %   Lymphs Abs 1.9 0.7 - 4.0 K/uL   Monocytes Relative 9 %   Monocytes Absolute 0.8 0.1 - 1.0 K/uL   Eosinophils Relative 2 %   Eosinophils Absolute 0.2 0.0 - 0.5 K/uL   Basophils Relative 0 %   Basophils Absolute 0.0 0.0 - 0.1 K/uL   Immature Granulocytes 0 %   Abs Immature Granulocytes 0.03 0.00 - 0.07 K/uL  Basic metabolic panel  Result Value Ref Range   Sodium 137 135 - 145 mmol/L   Potassium 3.7 3.5 - 5.1 mmol/L   Chloride 102 98 - 111 mmol/L   CO2 23 22 - 32 mmol/L   Glucose, Bld 107 (H) 70 - 99 mg/dL   BUN 14 6 - 20 mg/dL   Creatinine, Ser 1.04 0.61 - 1.24 mg/dL   Calcium 9.1 8.9 - 10.3 mg/dL   GFR calc non Af Amer >60 >60 mL/min   GFR calc Af Amer >60 >60 mL/min   Anion gap 12 5 - 15   Sedimentation rate  Result Value Ref Range   Sed Rate 31 (H) 0 - 16 mm/hr   CT Lumbar Spine Wo Contrast  Result Date: 08/01/2019 CLINICAL DATA:  Back pain EXAM: CT LUMBAR SPINE WITHOUT CONTRAST TECHNIQUE: Multidetector CT imaging of the lumbar spine was performed without intravenous contrast administration. Multiplanar CT image reconstructions were also generated. COMPARISON:  Lilie Vezina 4, 2020 FINDINGS: Segmentation: 5 lumbar type vertebrae. Alignment: Normal. Vertebrae: The patient is status post prior L5-S1 posterior fusion. Paraspinal and other soft tissues: There is some mild fat stranding anterior to the L5-S1 disc space. Disc levels: There is severe disc height loss at the L5-S1. The previously placed L5-S1 interbody spacer has been displaced inferolaterally to the right and is now malposition. There is some loosening of the pedicle screws through the sacrum, most notably on the right. IMPRESSION: 1. No acute displaced fracture. 2. Status post L5-S1 posterior fusion. Again noted is inferolateral migration of the L5-S1 interbody spacer as seen on multiple prior studies. 3. There is probable hardware loosening of the bilateral sacral pedicle screws, right worse than left. 4. Mild fat stranding anterior to the L5-S1 disc space of unknown clinical significance. Correlation with laboratory studies and inflammatory markers is recommended. If there is clinical concern for discitis osteomyelitis, follow-up with contrast enhanced MRI is recommended. Electronically Signed   By: Constance Holster M.D.   On: 08/01/2019 23:55    Radiology CT Lumbar Spine Wo  Contrast  Result Date: 08/01/2019 CLINICAL DATA:  Back pain EXAM: CT LUMBAR SPINE WITHOUT CONTRAST TECHNIQUE: Multidetector CT imaging of the lumbar spine was performed without intravenous contrast administration. Multiplanar CT image reconstructions were also generated. COMPARISON:  Moniqua Engebretsen 4, 2020 FINDINGS: Segmentation: 5 lumbar type vertebrae. Alignment:  Normal. Vertebrae: The patient is status post prior L5-S1 posterior fusion. Paraspinal and other soft tissues: There is some mild fat stranding anterior to the L5-S1 disc space. Disc levels: There is severe disc height loss at the L5-S1. The previously placed L5-S1 interbody spacer has been displaced inferolaterally to the right and is now malposition. There is some loosening of the pedicle screws through the sacrum, most notably on the right. IMPRESSION: 1. No acute displaced fracture. 2. Status post L5-S1 posterior fusion. Again noted is inferolateral migration of the L5-S1 interbody spacer as seen on multiple prior studies. 3. There is probable hardware loosening of the bilateral sacral pedicle screws, right worse than left. 4. Mild fat stranding anterior to the L5-S1 disc space of unknown clinical significance. Correlation with laboratory studies and inflammatory markers is recommended. If there is clinical concern for discitis osteomyelitis, follow-up with contrast enhanced MRI is recommended. Electronically Signed   By: Constance Holster M.D.   On: 08/01/2019 23:55    Procedures Procedures (including critical care time)  Medications Ordered in ED Medications  vancomycin (VANCOCIN) IVPB 1000 mg/200 mL premix (has no administration in time range)  piperacillin-tazobactam (ZOSYN) IVPB 3.375 g (3.375 g Intravenous New Bag/Given 08/02/19 0109)  ibuprofen (ADVIL) tablet 400 mg (400 mg Oral Given 08/01/19 2148)  haloperidol lactate (HALDOL) injection 5 mg (5 mg Intramuscular Given 08/02/19 0016)  fentaNYL (SUBLIMAZE) injection 50 mcg (50 mcg Intravenous Given 08/02/19 0102)  ketorolac (TORADOL) 30 MG/ML injection 30 mg (30 mg Intravenous Given 08/02/19 0049)    ED Course  I have reviewed the triage vital signs and the nursing notes.  Pertinent labs & imaging results that were available during my care of the patient were reviewed by me and considered in my medical decision making (see chart for  details).   Attempted to contact [facility] but they are in red and cannot take patient for admission at this time.     Case d/w Dr. Sherlyn Lick surgeon who performed lumbar surgery in 10/19.  He knee about loosening of the hardware.  He does not have privileges at [facility] but will follow MRI via epic.   Final Clinical Impression(s) / ED Diagnoses   Plan admit for pain control and MRI under anesthesia due to concern for diskitis raised on CT lumbar spine at [facility].      Laymon Stockert, MD 08/02/19 0413

## 2019-08-02 NOTE — Transfer of Care (Signed)
Immediate Anesthesia Transfer of Care Note  Patient: Isaiah Gentry  Procedure(s) Performed: MRI LUMBER WITH AND WITHOUT CONTRAST (N/A )  Patient Location: PACU  Anesthesia Type:MAC  Level of Consciousness: awake, alert  and oriented  Airway & Oxygen Therapy: Patient Spontanous Breathing and Patient connected to nasal cannula oxygen  Post-op Assessment: Report given to RN and Post -op Vital signs reviewed and stable  Post vital signs: Reviewed and stable  Last Vitals:  Vitals Value Taken Time  BP 105/58 08/02/19 1142  Temp 36.1 C 08/02/19 1142  Pulse 61 08/02/19 1148  Resp 15 08/02/19 1148  SpO2 96 % 08/02/19 1148  Vitals shown include unvalidated device data.  Last Pain:  Vitals:   08/02/19 1142  TempSrc:   PainSc: 3       Patients Stated Pain Goal: 1 (09/32/35 5732)  Complications: No apparent anesthesia complications

## 2019-08-02 NOTE — ED Notes (Signed)
Report given to: Lovell Sheehan, RN at [facility].

## 2019-08-02 NOTE — ED Notes (Signed)
Called [facility] for consult with hospitalist spoke with Benna Dunks

## 2019-08-02 NOTE — Plan of Care (Signed)
  Problem: Education: Goal: Knowledge of General Education information will improve Description: Including pain rating scale, medication(s)/side effects and non-pharmacologic comfort measures Outcome: Progressing   Problem: Activity: Goal: Risk for activity intolerance will decrease Outcome: Progressing   Problem: Pain Managment: Goal: General experience of comfort will improve Outcome: Progressing   

## 2019-08-02 NOTE — Anesthesia Preprocedure Evaluation (Addendum)
Anesthesia Evaluation  Patient identified by MRN, date of birth, ID band Patient awake    Reviewed: Allergy & Precautions, NPO status , Patient's Chart, lab work & pertinent test results  Airway Mallampati: I  TM Distance: >3 FB Neck ROM: Full    Dental  (+) Teeth Intact, Dental Advisory Given   Pulmonary asthma ,    breath sounds clear to auscultation       Cardiovascular negative cardio ROS   Rhythm:Regular Rate:Normal     Neuro/Psych  Headaches, negative psych ROS   GI/Hepatic negative GI ROS, Neg liver ROS,   Endo/Other  negative endocrine ROS  Renal/GU negative Renal ROS     Musculoskeletal  (+) Arthritis ,   Abdominal Normal abdominal exam  (+)   Peds  Hematology negative hematology ROS (+)   Anesthesia Other Findings   Reproductive/Obstetrics                            Anesthesia Physical Anesthesia Plan  ASA: II  Anesthesia Plan: MAC   Post-op Pain Management:    Induction: Intravenous  PONV Risk Score and Plan: 2 and Ondansetron, Midazolam and Propofol infusion  Airway Management Planned: Natural Airway and Simple Face Mask  Additional Equipment: None  Intra-op Plan:   Post-operative Plan: Extubation in OR  Informed Consent: I have reviewed the patients History and Physical, chart, labs and discussed the procedure including the risks, benefits and alternatives for the proposed anesthesia with the patient or authorized representative who has indicated his/her understanding and acceptance.     Dental advisory given  Plan Discussed with: CRNA  Anesthesia Plan Comments:        Anesthesia Quick Evaluation

## 2019-08-02 NOTE — ED Notes (Signed)
Called [facility] on call [phone]

## 2019-08-02 NOTE — Progress Notes (Addendum)
Patient arrived on floor around 0430. Pt stable with a pain level of 4. Dr. Truitt Merle, waiting for orders to be put in. Will continue to monitor.  Call back from MD Rathore around 0600, she will place a PRN medication for pain for patient until they are able to come up and assess the patient. She is currently busy in the ED and stated call if needed.

## 2019-08-03 ENCOUNTER — Encounter: Payer: Self-pay | Admitting: *Deleted

## 2019-08-03 DIAGNOSIS — M4640 Discitis, unspecified, site unspecified: Secondary | ICD-10-CM

## 2019-08-03 LAB — BASIC METABOLIC PANEL
Anion gap: 8 (ref 5–15)
BUN: 10 mg/dL (ref 6–20)
CO2: 27 mmol/L (ref 22–32)
Calcium: 8.6 mg/dL — ABNORMAL LOW (ref 8.9–10.3)
Chloride: 105 mmol/L (ref 98–111)
Creatinine, Ser: 1.04 mg/dL (ref 0.61–1.24)
GFR calc Af Amer: >60 mL/min (ref >60)
GFR calc non Af Amer: >60 mL/min (ref >60)
Glucose, Bld: 106 mg/dL — ABNORMAL HIGH (ref 70–99)
Potassium: 4 mmol/L (ref 3.5–5.1)
Sodium: 140 mmol/L (ref 135–145)

## 2019-08-03 LAB — CBC
HCT: 36.2 % — ABNORMAL LOW (ref 39.0–52.0)
Hemoglobin: 11.2 g/dL — ABNORMAL LOW (ref 13.0–17.0)
MCH: 24 pg — ABNORMAL LOW (ref 26.0–34.0)
MCHC: 30.9 g/dL (ref 30.0–36.0)
MCV: 77.7 fL — ABNORMAL LOW (ref 80.0–100.0)
Platelets: 243 10*3/uL (ref 150–400)
RBC: 4.66 MIL/uL (ref 4.22–5.81)
RDW: 14.2 % (ref 11.5–15.5)
WBC: 6.1 10*3/uL (ref 4.0–10.5)
nRBC: 0 % (ref 0.0–0.2)

## 2019-08-03 NOTE — Discharge Summary (Signed)
Physician Discharge Summary  Isaiah Gentry QQV:[id number] DOB: [birth year] DOA: 08/01/2019  PCP: Reubin Milan, MD  Admit date: 08/01/2019 Discharge date: 08/03/2019  Admitted From: home Disposition:  home  Recommendations for Outpatient Follow-up:  1. Follow up with PCP in 1-2 weeks  Home Health: none Equipment/Devices: none  Discharge Condition: stable  CODE STATUS: Full code Diet recommendation: regular   HPI: Per admitting MD, Isaiah Gentry is a 44 y.o. male with medical history significant of chronic back pain with failed back surgery presenting with back pain.  He had 2 spinal fusions in the last year and they didn't go well.  For the last couple of weeks, he has been having back pain.  He is waiting on a spinal cord stimulator in February.  He had a fare last night and went to [facility].  He last had fevers a month or two ago.  When this started in April, he had an MRI and there was concern for infection from the first fusion; instead it seemed to be hardware issue instead.  He is unable to lie flat and so he got conscious sedation for his last MRI.  He has chronic right-sided radiculopathy.  No saddle anesthesia, no loss of bowel or bladder.  Hospital Course / Discharge diagnoses: Back pain -Patient with prior h/o failed lumbar stenosis surgery x 2 who has chronic back pain (see below) presenting with acute worsening pain. CT showed concern for diskitis/osteo vs. Hardware malfunction. Patient was transferred overnight from [facility] for MRI under anesthesia (cannot lie flat). MRI performed and appears to show chronic issues without apparent acute worsening or infection. Neurosurgery consulted and evaluated patient while hospitalized, and no acute interventions were recommended. He is scheduled for spinal cord stimulator soon. He got blood cultures which are negative at the time of dc however patient aware that for some reason these are to turn positive he will be called to come back.   Chronic pain syndrome -Resume home medications - Buprenorphine, Oxycodone, Lyrica, Cymbalta Asthma -Continue home medications. No wheezing  Obesity -BMI 31.9, Weight loss should be encouraged  Discharge Instructions   Allergies as of 08/03/2019      Reactions   Erythromycin Shortness Of Breath   Anthrax Vaccine Swelling   Joints lock up and run fever      Medication List    TAKE these medications   albuterol 108 (90 Base) MCG/ACT inhaler Commonly known as: VENTOLIN HFA Inhale 2 puffs into the lungs every 6 (six) hours as needed for wheezing or shortness of breath.   buprenorphine 2 MG Subl SL tablet Commonly known as: SUBUTEX Place 2 mg under the tongue daily at 12 noon.   cetirizine 10 MG tablet Commonly known as: ZYRTEC Take 10 mg by mouth at bedtime as needed for allergies.   DULoxetine 60 MG capsule Commonly known as: CYMBALTA Take 60 mg by mouth 2 (two) times daily.   Oxycodone HCl 10 MG Tabs Take 10 mg by mouth every 6 (six) hours. Scheduled doses. Not PRN. Start taking on: August 04, 2019   pregabalin 150 MG capsule Commonly known as: LYRICA Take 150-300 mg by mouth See admin instructions. Take 19m in the AM and 3075min the PM   SUMAtriptan 50 MG tablet Commonly known as: IMITREX Take 50 mg by mouth every 2 (two) hours as needed for migraine. May repeat in 2 hours if headache persists or recurs.        Consultations:  Neurosurgery   Procedures/Studies:  CT Lumbar Spine Wo Contrast  Result Date: 08/01/2019 CLINICAL DATA:  Back pain EXAM: CT LUMBAR SPINE WITHOUT CONTRAST TECHNIQUE: Multidetector CT imaging of the lumbar spine was performed without intravenous contrast administration. Multiplanar CT image reconstructions were also generated. COMPARISON:  October 23, 2018 FINDINGS: Segmentation: 5 lumbar type vertebrae. Alignment: Normal. Vertebrae: The patient is status post prior L5-S1 posterior fusion. Paraspinal and other soft tissues: There is some  mild fat stranding anterior to the L5-S1 disc space. Disc levels: There is severe disc height loss at the L5-S1. The previously placed L5-S1 interbody spacer has been displaced inferolaterally to the right and is now malposition. There is some loosening of the pedicle screws through the sacrum, most notably on the right. IMPRESSION: 1. No acute displaced fracture. 2. Status post L5-S1 posterior fusion. Again noted is inferolateral migration of the L5-S1 interbody spacer as seen on multiple prior studies. 3. There is probable hardware loosening of the bilateral sacral pedicle screws, right worse than left. 4. Mild fat stranding anterior to the L5-S1 disc space of unknown clinical significance. Correlation with laboratory studies and inflammatory markers is recommended. If there is clinical concern for discitis osteomyelitis, follow-up with contrast enhanced MRI is recommended. Electronically Signed   By: Constance Holster M.D.   On: 08/01/2019 23:55   MR Lumbar Spine W Wo Contrast  Result Date: 08/02/2019 CLINICAL DATA:  Low back pain. History of lumbar surgery. History of diskitis/osteomyelitis at L5-S1 EXAM: MRI LUMBAR SPINE WITHOUT AND WITH CONTRAST TECHNIQUE: Multiplanar and multiecho pulse sequences of the lumbar spine were obtained without and with intravenous contrast. CONTRAST:  56m GADAVIST GADOBUTROL 1 MMOL/ML IV SOLN COMPARISON:  Multiple prior examinations. CT lumbar spine from yesterday and MRI lumbar spine 05/25/2019. FINDINGS: Segmentation: There are five lumbar type vertebral bodies. The last full intervertebral disc space is labeled L5-S1. Alignment:  Normal Vertebrae: No significant signal abnormality or enhancement involving L5 or S1 to suggest osteomyelitis. Probable enhancing granulation tissue in the disc space at L5-S1 but no findings suspicious for discitis. Conus medullaris and cauda equina: Conus extends to the bottom of T12 level. Conus and cauda equina appear normal. Paraspinal and  other soft tissues: No significant paraspinal or retroperitoneal findings. Stable extruded interbody fusion device at L5-S1 anteriorly on the right side. Disc levels: L1-2: No significant findings. L2-3: No significant findings.  Mild facet disease. L3-4: Mild to moderate facet disease but no disc protrusions, spinal or foraminal stenosis. Mild lateral recess encroachment bilaterally. L4-5: No disc protrusions, spinal or foraminal stenosis. Moderate facet disease. L5-S1: Stable postoperative changes. No MR findings suspicious for recurrent discitis/osteomyelitis. IMPRESSION: 1. Stable MR appearance of the lumbar spine when compared to the most recent study from 05/25/2019. I do not see any findings suspicious for recurrent discitis or osteomyelitis at L5-S1. 2. Stable postoperative changes at L5-S1 with the extruded interbody fusion device anteriorly on the right side. 3. The other intervertebral disc spaces are unremarkable and unchanged. Electronically Signed   By: PMarijo SanesM.D.   On: 08/02/2019 13:04     Subjective: - no chest pain, shortness of breath, no abdominal pain, nausea or vomiting. Pain better   Discharge Exam: BP 116/71 (BP Location: Left Arm)   Pulse 81   Temp 98.2 F (36.8 C) (Oral)   Resp 17   Ht 6' 0.01" (1.829 m)   Wt 106.6 kg   SpO2 98%   BMI 31.86 kg/m   General: Pt is alert, awake, not in acute distress Cardiovascular: RRR,  S1/S2 +, no rubs, no gallops Respiratory: CTA bilaterally, no wheezing, no rhonchi Abdominal: Soft, NT, ND, bowel sounds + Extremities: no edema, no cyanosis    The results of significant diagnostics from this hospitalization (including imaging, microbiology, ancillary and laboratory) are listed below for reference.     Microbiology: Recent Results (from the past 240 hour(s))  SARS Coronavirus 2 Ag (30 min TAT) - Nasal Swab (BD Veritor Kit)     Status: None   Collection Time: 08/02/19 12:34 AM   Specimen: Nasal Swab (BD Veritor Kit)   Result Value Ref Range Status   SARS Coronavirus 2 Ag NEGATIVE NEGATIVE Final    Comment: (NOTE) SARS-CoV-2 antigen NOT DETECTED.  Negative results are presumptive.  Negative results do not preclude SARS-CoV-2 infection and should not be used as the sole basis for treatment or other patient management decisions, including infection  control decisions, particularly in the presence of clinical signs and  symptoms consistent with COVID-19, or in those who have been in contact with the virus.  Negative results must be combined with clinical observations, patient history, and epidemiological information. The expected result is Negative. Fact Sheet for Patients: PodPark.tn Fact Sheet for Healthcare Providers: GiftContent.is This test is not yet approved or cleared by the [place] FDA and  has been authorized for detection and/or diagnosis of SARS-CoV-2 by FDA under an Emergency Use Authorization (EUA).  This EUA will remain in effect (meaning this test can be used) for the duration of  the COVID-19 de claration under Section 564(b)(1) of the Act, 21 U.S.C. section 360bbb-3(b)(1), unless the authorization is terminated or revoked sooner. Performed at [facility], [address] [place], [place] [address]   Blood culture (routine x 2)     Status: None (Preliminary result)   Collection Time: 08/02/19 12:55 AM   Specimen: BLOOD RIGHT HAND  Result Value Ref Range Status   Specimen Description   Final    BLOOD RIGHT HAND Performed at [facility], [address] [place], [place] [address]    Special Requests   Final    BOTTLES DRAWN AEROBIC AND ANAEROBIC Blood Culture adequate volume Performed at [facility], [address] [place], [place] [address]    Culture   Final    NO GROWTH < 24 HOURS Performed at [facility], [place] [address], [place], [place] [address]    Report Status  PENDING  Incomplete  Blood culture (routine x 2)     Status: None (Preliminary result)   Collection Time: 08/02/19  1:10 AM   Specimen: BLOOD LEFT HAND  Result Value Ref Range Status   Specimen Description   Final    BLOOD LEFT HAND Performed at [facility], [address] [place], [place] [address]    Special Requests   Final    BOTTLES DRAWN AEROBIC AND ANAEROBIC Blood Culture adequate volume Performed at [facility], [address] [place], [place] [address]    Culture   Final    NO GROWTH < 24 HOURS Performed at [facility], [place] [address], [place], [place] [address]    Report Status PENDING  Incomplete     Labs: Basic Metabolic Panel: Recent Labs  Lab 08/02/19 0034 08/03/19 0427  NA 137 140  K 3.7 4.0  CL 102 105  CO2 23 27  GLUCOSE 107* 106*  BUN 14 10  CREATININE 1.04 1.04  CALCIUM 9.1 8.6*   Liver Function Tests: No results for input(s): AST, ALT, ALKPHOS, BILITOT, PROT, ALBUMIN in the last 168 hours. CBC: Recent Labs  Lab 08/02/19 0034 08/03/19 0427  WBC 8.9 6.1  NEUTROABS 6.0  --   HGB 13.3 11.2*  HCT 43.0 36.2*  MCV 76.9* 77.7*  PLT 306 243   CBG: No results for input(s): GLUCAP in the last 168 hours. Hgb A1c No results for input(s): HGBA1C in the last 72 hours. Lipid Profile No results for input(s): CHOL, HDL, LDLCALC, TRIG, CHOLHDL, LDLDIRECT in the last 72 hours. Thyroid function studies No results for input(s): TSH, T4TOTAL, T3FREE, THYROIDAB in the last 72 hours.  Invalid input(s): FREET3 Urinalysis    Component Value Date/Time   COLORURINE YELLOW 08/02/2019 0352   APPEARANCEUR HAZY (A) 08/02/2019 0352   LABSPEC 1.025 08/02/2019 0352   PHURINE 6.0 08/02/2019 0352   GLUCOSEU NEGATIVE 08/02/2019 0352   HGBUR NEGATIVE 08/02/2019 0352   BILIRUBINUR NEGATIVE 08/02/2019 0352   KETONESUR NEGATIVE 08/02/2019 0352   PROTEINUR NEGATIVE 08/02/2019 0352   NITRITE NEGATIVE 08/02/2019 0352   LEUKOCYTESUR  NEGATIVE 08/02/2019 0352    FURTHER DISCHARGE INSTRUCTIONS:   Get Medicines reviewed and adjusted: Please take all your medications with you for your next visit with your Primary MD   Laboratory/radiological data: Please request your Primary MD to go over all hospital tests and procedure/radiological results at the follow up, please ask your Primary MD to get all Hospital records sent to his/her office.   In some cases, they will be blood work, cultures and biopsy results pending at the time of your discharge. Please request that your primary care M.D. goes through all the records of your hospital data and follows up on these results.   Also Note the following: If you experience worsening of your admission symptoms, develop shortness of breath, life threatening emergency, suicidal or homicidal thoughts you must seek medical attention immediately by calling 911 or calling your MD immediately  if symptoms less severe.   You must read complete instructions/literature along with all the possible adverse reactions/side effects for all the Medicines you take and that have been prescribed to you. Take any new Medicines after you have completely understood and accpet all the possible adverse reactions/side effects.    Do not drive when taking Pain medications or sleeping medications (Benzodaizepines)   Do not take more than prescribed Pain, Sleep and Anxiety Medications. It is not advisable to combine anxiety,sleep and pain medications without talking with your primary care practitioner   Special Instructions: If you have smoked or chewed Tobacco  in the last 2 yrs please stop smoking, stop any regular Alcohol  and or any Recreational drug use.   Wear Seat belts while driving.   Please note: You were cared for by a hospitalist during your hospital stay. Once you are discharged, your primary care physician will handle any further medical issues. Please note that NO REFILLS for any discharge  medications will be authorized once you are discharged, as it is imperative that you return to your primary care physician (or establish a relationship with a primary care physician if you do not have one) for your post hospital discharge needs so that they can reassess your need for medications and monitor your lab values.  Time coordinating discharge: 25 minutes  SIGNED:  Marzetta Board, MD, PhD 08/03/2019, 10:18 AM

## 2019-08-03 NOTE — Progress Notes (Signed)
Pt is ambulating with walker in the room and to the hallway. Back pain is controlled with oral narcotics.  Ready to go home. Discharge instructions given to pt, discharged to home picked up by his wife.

## 2019-08-04 NOTE — Anesthesia Postprocedure Evaluation (Signed)
Anesthesia Post Note  Patient: Isaiah Gentry  Procedure(s) Performed: MRI LUMBER WITH AND WITHOUT CONTRAST (N/A )     Patient location during evaluation: PACU Anesthesia Type: MAC Level of consciousness: awake and alert Pain management: pain level controlled Vital Signs Assessment: post-procedure vital signs reviewed and stable Respiratory status: spontaneous breathing, nonlabored ventilation, respiratory function stable and patient connected to nasal cannula oxygen Cardiovascular status: stable and blood pressure returned to baseline Postop Assessment: no apparent nausea or vomiting Anesthetic complications: no    Last Vitals:  Vitals:   08/03/19 0407 08/03/19 0840  BP: (!) 101/57 116/71  Pulse: 71 81  Resp: 17 17  Temp: 37 C 36.8 C  SpO2: 98% 98%    Last Pain:  Vitals:   08/03/19 0840  TempSrc: Oral  PainSc:    Pain Goal: Patients Stated Pain Goal: 3 (08/02/19 2023)                 Effie Berkshire

## 2019-08-07 LAB — CULTURE, BLOOD (ROUTINE X 2)
Culture: NO GROWTH
Culture: NO GROWTH
Special Requests: ADEQUATE
Special Requests: ADEQUATE

## 2019-08-09 MED FILL — Midazolam HCl Inj 2 MG/2ML (Base Equivalent): INTRAMUSCULAR | Qty: 2 | Status: AC

## 2019-08-09 MED FILL — Propofol IV Emul 200 MG/20ML (10 MG/ML): INTRAVENOUS | Qty: 20 | Status: AC

## 2019-08-09 MED FILL — Fentanyl Citrate Preservative Free (PF) Inj 100 MCG/2ML: INTRAMUSCULAR | Qty: 2 | Status: AC

## 2019-08-09 MED FILL — Ondansetron HCl Inj 4 MG/2ML (2 MG/ML): INTRAMUSCULAR | Qty: 2 | Status: AC

## 2019-12-28 ENCOUNTER — Encounter (HOSPITAL_BASED_OUTPATIENT_CLINIC_OR_DEPARTMENT_OTHER): Payer: Self-pay

## 2019-12-28 ENCOUNTER — Other Ambulatory Visit: Payer: Self-pay

## 2019-12-28 ENCOUNTER — Emergency Department (HOSPITAL_BASED_OUTPATIENT_CLINIC_OR_DEPARTMENT_OTHER)
Admission: EM | Admit: 2019-12-28 | Discharge: 2019-12-28 | Disposition: A | Discharge status: Home / Self Care | Payer: No Typology Code available for payment source | Attending: Emergency Medicine | Admitting: Emergency Medicine

## 2019-12-28 DIAGNOSIS — R5383 Other fatigue: Secondary | ICD-10-CM | POA: Diagnosis not present

## 2019-12-28 DIAGNOSIS — J45909 Unspecified asthma, uncomplicated: Secondary | ICD-10-CM | POA: Diagnosis not present

## 2019-12-28 DIAGNOSIS — Z79899 Other long term (current) drug therapy: Secondary | ICD-10-CM | POA: Insufficient documentation

## 2019-12-28 DIAGNOSIS — R42 Dizziness and giddiness: Secondary | ICD-10-CM | POA: Insufficient documentation

## 2019-12-28 HISTORY — DX: Other specified abnormal findings of blood chemistry: R79.89

## 2019-12-28 HISTORY — DX: Iron deficiency anemia, unspecified: D50.9

## 2019-12-28 NOTE — ED Notes (Signed)
Formatting of this note might be different from the original.  ED Provider at bedside.  Electronically signed by Shea Stakes, RN at 12/28/2019  8:18 PM EDT

## 2019-12-28 NOTE — ED Provider Notes (Signed)
Formatting of this note is different from the original.  Images from the original note were not included.    [facility] EMERGENCY DEPARTMENT  Provider Note    CSN: [id number]  Arrival date & time: 12/28/19  1746        History  Chief Complaint   Patient presents with   ? Anemia     Ronald Garza is a 44 y.o. male.    HPI        Hx of spinal fusion 2019, had staph infection and surgery in 2020, had 2 PICC lines abx 2019 was septic then had another  Still being followed by ID and on doxycyline  Supposed to have surgery 6/21 but pushed back due to iron deficiency  Fatigue, exhaustion, chronic pain on chronic narcotics  Was scheduled for fusion but due to iron deficiency anemia unable to have surgery  This Sunday AM significant fatigue, sleepy/sleeping all day  Yesterday and today feels dizzy, lightheaded when standing up  Chronic symptoms of fatigue but worse over the last 4 days    Went to [facility] drew blood today and came here    No fevers, chills, cough, urinary symptoms, n/v/diarrhea  No chest pain or shortness of breath (hx of asthma, occ)  Appetite fine    Medication same with exception of addition of iron pill    Dr. Malachy Chamber Spine surgeon     Past Medical History:   Diagnosis Date   ? Asthma    ? Back pain    ? Chronic pain syndrome    ? DDD (degenerative disc disease), thoracic    ? Diverticulitis    ? Failed back surgical syndrome    ? Iron deficiency anemia    ? Low testosterone    ? Migraine    ? Osteoarthritis of lumbar spine      Patient Active Problem List    Diagnosis Date Noted   ? Back pain 08/02/2019   ? Chronic pain syndrome 08/02/2019   ? Asthma in adult without complication 08/02/2019   ? Obesity (BMI 30.0-34.9) 08/02/2019     Past Surgical History:   Procedure Laterality Date   ? BACK SURGERY     ? BUNIONECTOMY     ? ELBOW SURGERY     ? RADIOLOGY WITH ANESTHESIA N/A 08/02/2019    Procedure: MRI LUMBER WITH AND WITHOUT CONTRAST;  Surgeon: Radiologist, Medication, MD;  Location: [facility] OR;   Service: Radiology;  Laterality: N/A;       No family history on file.    Social History     Tobacco Use   ? Smoking status: Never Smoker   ? Smokeless tobacco: Never Used   Vaping Use   ? Vaping Use: Never used   Substance Use Topics   ? Alcohol use: No   ? Drug use: No     Home Medications  Prior to Admission medications    Medication Sig Start Date End Date Taking? Authorizing Provider   albuterol (PROVENTIL HFA;VENTOLIN HFA) 108 (90 Base) MCG/ACT inhaler Inhale 2 puffs into the lungs every 6 (six) hours as needed for wheezing or shortness of breath.    [provider]   buprenorphine (SUBUTEX) 2 MG SUBL SL tablet Place 2 mg under the tongue daily at 12 noon.  07/20/19   [provider]   cetirizine (ZYRTEC) 10 MG tablet Take 10 mg by mouth at bedtime as needed for allergies.     [provider]  DULoxetine (CYMBALTA) 60 MG capsule Take 60 mg by mouth 2 (two) times daily.     [provider]   Oxycodone HCl 10 MG TABS Take 10 mg by mouth every 6 (six) hours. Scheduled doses. Not PRN. 08/04/19   [provider]   pregabalin (LYRICA) 150 MG capsule Take 150-300 mg by mouth See admin instructions. Take 150mg  in the AM and 300mg  in the PM    [provider]   SUMAtriptan (IMITREX) 50 MG tablet Take 50 mg by mouth every 2 (two) hours as needed for migraine. May repeat in 2 hours if headache persists or recurs.    [provider]     Allergies     Erythromycin and Anthrax vaccine    Review of Systems    Review of Systems   Constitutional: Positive for fatigue. Negative for appetite change and fever.   HENT: Negative for sore throat.    Eyes: Negative for visual disturbance.   Respiratory: Negative for cough and shortness of breath.    Cardiovascular: Negative for chest pain.   Gastrointestinal: Negative for abdominal pain, nausea and vomiting.   Genitourinary: Negative for difficulty urinating.   Musculoskeletal: Negative for back pain and neck stiffness.    Skin: Negative for rash.   Neurological: Positive for light-headedness. Negative for syncope, weakness, numbness and headaches.     Physical Exam  Updated Vital Signs  BP 124/80   Pulse 64   Temp 98.9 F (37.2 C) (Oral)   Resp 12   Ht 6' (1.829 m)   Wt 108.9 kg   SpO2 100%   BMI 32.55 kg/m     Physical Exam  Vitals and nursing note reviewed.   Constitutional:       General: He is not in acute distress.     Appearance: Normal appearance. He is well-developed. He is not ill-appearing or diaphoretic.   HENT:      Head: Normocephalic and atraumatic.   Eyes:      General: No visual field deficit.     Extraocular Movements: Extraocular movements intact.      Conjunctiva/sclera: Conjunctivae normal.      Pupils: Pupils are equal, round, and reactive to light.   Cardiovascular:      Rate and Rhythm: Normal rate and regular rhythm.      Pulses: Normal pulses.      Heart sounds: Normal heart sounds. No murmur heard.   No friction rub. No gallop.    Pulmonary:      Effort: Pulmonary effort is normal. No respiratory distress.      Breath sounds: Normal breath sounds. No wheezing or rales.   Abdominal:      General: There is no distension.      Palpations: Abdomen is soft.      Tenderness: There is no abdominal tenderness. There is no guarding.   Musculoskeletal:         General: No swelling or tenderness.      Cervical back: Normal range of motion.   Skin:     General: Skin is warm and dry.      Findings: No erythema or rash.   Neurological:      General: No focal deficit present.      Mental Status: He is alert and oriented to person, place, and time.      GCS: GCS eye subscore is 4. GCS verbal subscore is 5. GCS motor subscore is 6.      Cranial Nerves:  No cranial nerve deficit, dysarthria or facial asymmetry.      Sensory: No sensory deficit.      Motor: No weakness (no change from baseline, weakness R hip flexion) or tremor.      Coordination: Coordination normal. Finger-Nose-Finger Test normal.      Gait: Gait  normal.     ED Results / Procedures / Treatments    Labs  (all labs ordered are listed, but only abnormal results are displayed)  Labs Reviewed - No data to display    EKG  EKG Interpretation    Date/Time:  Wednesday December 28 2019 20:46:25 EDT  Ventricular Rate:  64  PR Interval:     QRS Duration: 98  QT Interval:  427  QTC Calculation: 441  R Axis:   55  Text Interpretation: Sinus rhythm Low voltage, precordial leads Reconfirmed by Alvira Monday ([id number]) on 12/29/2019 8:54:45 AM    Radiology  No results found.    Procedures  Procedures (including critical care time)    Medications Ordered in ED  Medications - No data to display    ED Course   I have reviewed the triage vital signs and the nursing notes.    Pertinent labs & imaging results that were available during my care of the patient were reviewed by me and considered in my medical decision making (see chart for details).      MDM Rules/Calculators/A&P      44yo male with history of DDD L5-S1 OLIF in 2019 complicated by discitis/osteomyelitis with repeat surgery 2020 and 2 rounds of PICC line abx now on chronic doxycycline per patient for and loosening of hardware scheduled for surgery later this month but was delayed due to iron deficiency/microcytic anemia who has had chronic pain syndrome, low testosterone seeing endocrinology, and chronic fatigue presents with worsening fatigue and lightheadedness over the last few days.  EKG with normal rhythm and unchanged from previous.     Reviewed labs drawn today at [facility]---Hgb 13.2, CMP shows Cr 1, Sodium 138, K 3.7. Troponin 7.  No sign of significant anemia, electrolyte abnormality. No sign of arrhythmia, ACS, PE or acute infection.  Symptoms may be related to chronic infection (although unable to see notes from ID) or low testosterone. Do not see emergent pathology as etiology of symptoms.    Recommend continued follow up with PCP, endocrinologist, ID specialists and [facility]. Patient discharged in stable condition with  understanding of reasons to return.     Final Clinical Impression(s) / ED Diagnoses  Final diagnoses:   Fatigue, unspecified type   Lightheadedness     Rx / DC Orders  ED Discharge Orders     None         Alvira Monday, MD  12/29/19 1601    Electronically signed by Alvira Monday, MD at 12/29/2019  9:01 AM EDT

## 2019-12-28 NOTE — ED Triage Notes (Signed)
Formatting of this note might be different from the original.  Pt c/o "iron deficiency anemia" x 3 months-pt c/o "exhaustion, dizziness" x 1 week-states he had labs drawn however [facility] ED just PTA-NAD-steady gait    Electronically signed by Angeline Slim, RN at 12/28/2019  6:03 PM EDT

## 2019-12-28 NOTE — ED Triage Notes (Addendum)
Pt c/o "iron deficiency anemia" x 3 months-pt c/o "exhaustion, dizziness" x 1 week-states he had labs drawn however [facility] ED just PTA-NAD-steady gait

## 2019-12-28 NOTE — ED Provider Notes (Signed)
[facility] EMERGENCY DEPARTMENT Provider Note   CSN: [id number] Arrival date & time: 12/28/19  1746     History Chief Complaint  Patient presents with  . Anemia    Isaiah Gentry is a 44 y.o. male.  HPI      Hx of spinal fusion 2019, had staph infection and surgery in 2020, had 2 PICC lines abx 2019 was septic then had another Still being followed by ID and on doxycyline Supposed to have surgery 6/21 but pushed back due to iron deficiency Fatigue, exhaustion, chronic pain on chronic narcotics Was scheduled for fusion but due to iron deficiency anemia unable to have surgery This Sunday AM significant fatigue, sleepy/sleeping all day Yesterday and today feels dizzy, lightheaded when standing up Chronic symptoms of fatigue but worse over the last 4 days  Went to [facility], drew blood today and came here  No fevers, chills, cough, urinary symptoms, n/v/diarrhea No chest pain or shortness of breath (hx of asthma, occ) Appetite fine   Medication same with exception of addition of iron pill  Dr. Malachy Chamber Spine surgeon   Past Medical History:  Diagnosis Date  . Asthma   . Back pain   . Chronic pain syndrome   . DDD (degenerative disc disease), thoracic   . Diverticulitis   . Failed back surgical syndrome   . Iron deficiency anemia   . Low testosterone   . Migraine   . Osteoarthritis of lumbar spine     Patient Active Problem List   Diagnosis Date Noted  . Back pain 08/02/2019  . Chronic pain syndrome 08/02/2019  . Asthma in adult without complication 08/02/2019  . Obesity (BMI 30.0-34.9) 08/02/2019    Past Surgical History:  Procedure Laterality Date  . BACK SURGERY    . BUNIONECTOMY    . ELBOW SURGERY    . RADIOLOGY WITH ANESTHESIA N/A 08/02/2019   Procedure: MRI LUMBER WITH AND WITHOUT CONTRAST;  Surgeon: Radiologist, Medication, MD;  Location: [facility] OR;  Service: Radiology;  Laterality: N/A;       No family history on file.  Social  History   Tobacco Use  . Smoking status: Never Smoker  . Smokeless tobacco: Never Used  Vaping Use  . Vaping Use: Never used  Substance Use Topics  . Alcohol use: No  . Drug use: No    Home Medications Prior to Admission medications   Medication Sig Start Date End Date Taking? Authorizing Provider  albuterol (PROVENTIL HFA;VENTOLIN HFA) 108 (90 Base) MCG/ACT inhaler Inhale 2 puffs into the lungs every 6 (six) hours as needed for wheezing or shortness of breath.    [provider]  buprenorphine (SUBUTEX) 2 MG SUBL SL tablet Place 2 mg under the tongue daily at 12 noon.  07/20/19   [provider]  cetirizine (ZYRTEC) 10 MG tablet Take 10 mg by mouth at bedtime as needed for allergies.     [provider]  DULoxetine (CYMBALTA) 60 MG capsule Take 60 mg by mouth 2 (two) times daily.     [provider]  Oxycodone HCl 10 MG TABS Take 10 mg by mouth every 6 (six) hours. Scheduled doses. Not PRN. 08/04/19   [provider]  pregabalin (LYRICA) 150 MG capsule Take 150-300 mg by mouth See admin instructions. Take 150mg  in the AM and 300mg  in the PM    [provider]  SUMAtriptan (IMITREX) 50 MG tablet Take 50 mg by mouth every 2 (two) hours as needed  for migraine. May repeat in 2 hours if headache persists or recurs.    [provider]    Allergies    Erythromycin and Anthrax vaccine  Review of Systems   Review of Systems  Constitutional: Positive for fatigue. Negative for appetite change and fever.  HENT: Negative for sore throat.   Eyes: Negative for visual disturbance.  Respiratory: Negative for cough and shortness of breath.   Cardiovascular: Negative for chest pain.  Gastrointestinal: Negative for abdominal pain, nausea and vomiting.  Genitourinary: Negative for difficulty urinating.  Musculoskeletal: Negative for back pain and neck stiffness.  Skin: Negative for rash.  Neurological: Positive for light-headedness.  Negative for syncope, weakness, numbness and headaches.    Physical Exam Updated Vital Signs BP 124/80   Pulse 64   Temp 98.9 F (37.2 C) (Oral)   Resp 12   Ht 6' (1.829 m)   Wt 108.9 kg   SpO2 100%   BMI 32.55 kg/m   Physical Exam Vitals and nursing note reviewed.  Constitutional:      General: He is not in acute distress.    Appearance: Normal appearance. He is well-developed. He is not ill-appearing or diaphoretic.  HENT:     Head: Normocephalic and atraumatic.  Eyes:     General: No visual field deficit.    Extraocular Movements: Extraocular movements intact.     Conjunctiva/sclera: Conjunctivae normal.     Pupils: Pupils are equal, round, and reactive to light.  Cardiovascular:     Rate and Rhythm: Normal rate and regular rhythm.     Pulses: Normal pulses.     Heart sounds: Normal heart sounds. No murmur heard.  No friction rub. No gallop.   Pulmonary:     Effort: Pulmonary effort is normal. No respiratory distress.     Breath sounds: Normal breath sounds. No wheezing or rales.  Abdominal:     General: There is no distension.     Palpations: Abdomen is soft.     Tenderness: There is no abdominal tenderness. There is no guarding.  Musculoskeletal:        General: No swelling or tenderness.     Cervical back: Normal range of motion.  Skin:    General: Skin is warm and dry.     Findings: No erythema or rash.  Neurological:     General: No focal deficit present.     Mental Status: He is alert and oriented to person, place, and time.     GCS: GCS eye subscore is 4. GCS verbal subscore is 5. GCS motor subscore is 6.     Cranial Nerves: No cranial nerve deficit, dysarthria or facial asymmetry.     Sensory: No sensory deficit.     Motor: No weakness (no change from baseline, weakness R hip flexion) or tremor.     Coordination: Coordination normal. Finger-Nose-Finger Test normal.     Gait: Gait normal.     ED Results / Procedures / Treatments   Labs (all labs  ordered are listed, but only abnormal results are displayed) Labs Reviewed - No data to display  EKG EKG Interpretation  Date/Time:  Wednesday December 28 2019 20:46:25 EDT Ventricular Rate:  64 PR Interval:    QRS Duration: 98 QT Interval:  427 QTC Calculation: 441 R Axis:   55 Text Interpretation: Sinus rhythm Low voltage, precordial leads Reconfirmed by Alvira Monday ([id number]) on 12/29/2019 8:54:45 AM   Radiology No results found.  Procedures Procedures (including critical care time)  Medications Ordered in ED Medications - No data to display  ED Course  I have reviewed the triage vital signs and the nursing notes.  Pertinent labs & imaging results that were available during my care of the patient were reviewed by me and considered in my medical decision making (see chart for details).    MDM Rules/Calculators/A&P                      44yo male with history of DDD L5-S1 OLIF in 3818 complicated by discitis/osteomyelitis with repeat surgery 2020 and 2 rounds of PICC line abx now on chronic doxycycline per patient for and loosening of hardware scheduled for surgery later this month but was delayed due to iron deficiency/microcytic anemia who has had chronic pain syndrome, low testosterone seeing endocrinology, and chronic fatigue presents with worsening fatigue and lightheadedness over the last few days.  EKG with normal rhythm and unchanged from previous.   Reviewed labs drawn today at [facility]---Hgb 13.2, CMP shows Cr 1, Sodium 138, K 3.7. Troponin 7.  No sign of significant anemia, electrolyte abnormality. No sign of arrhythmia, ACS, PE or acute infection.  Symptoms may be related to chronic infection (although unable to see notes from ID) or low testosterone. Do not see emergent pathology as etiology of symptoms.  Recommend continued follow up with PCP, endocrinologist, ID specialists and [facility]. Patient discharged in stable condition with understanding of reasons to return.     Final Clinical Impression(s) / ED Diagnoses Final diagnoses:  Fatigue, unspecified type  Lightheadedness    Rx / DC Orders ED Discharge Orders    None       Gareth Morgan, MD 12/29/19 [phone]

## 2019-12-28 NOTE — ED Notes (Signed)
ED Provider at bedside. 

## 2020-03-30 IMAGING — CT CT ABDOMEN AND PELVIS WITH CONTRAST
2 of 5 series · 15 of 46 positions shown, 17 images · IV contrast (APPLIED)
Comparison: April 29, 2018

CLINICAL DATA: Lower abdominal pain with nausea and vomiting

EXAM:
CT ABDOMEN AND PELVIS WITH CONTRAST
TECHNIQUE: Multidetector CT imaging of the abdomen and pelvis was performed
using the standard protocol following bolus administration of
intravenous contrast. Oral contrast was also administered.
CONTRAST:  15mL [id number] IOPAMIDOL ([id number]) INJECTION 61%,
100mL OMNIPAQUE IOHEXOL 300 MG/ML SOLN

[Series 2: axial st · axial · 0.98mm/px · z∈[-536,-26]mm · 12 of 115 slices shown, 14 images]
[im 7/115  soft-tissue]
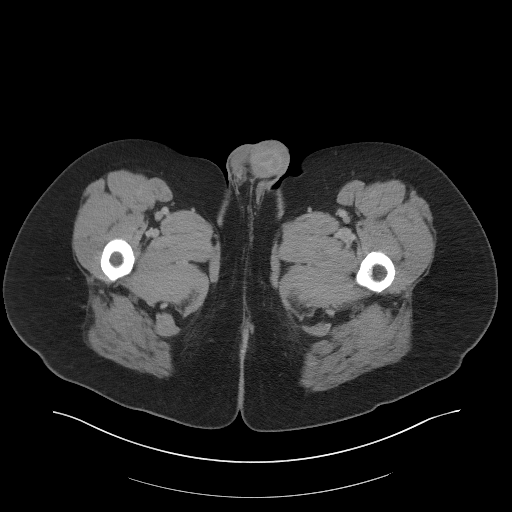
[im 7/115  bone]
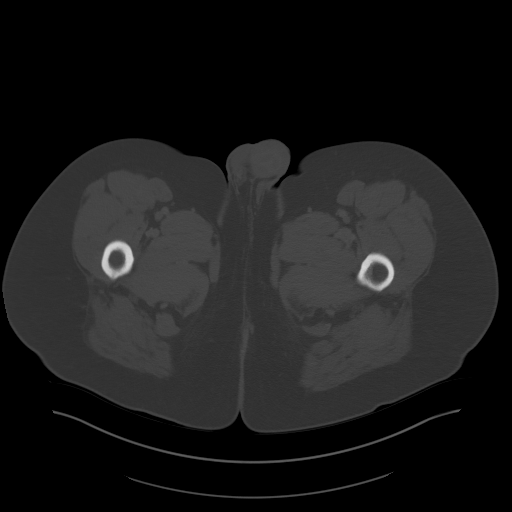
[im 19/115  soft-tissue]
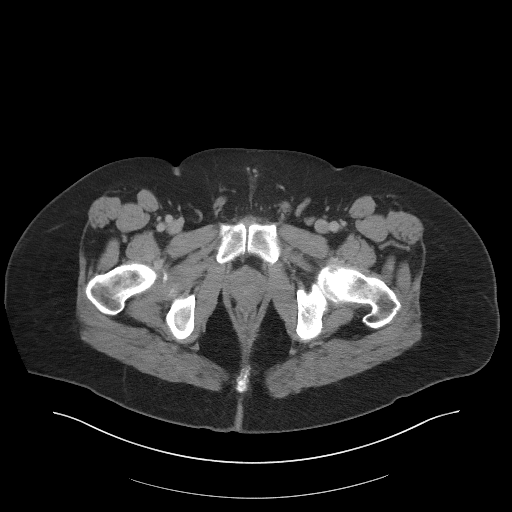
[im 25/115  soft-tissue]
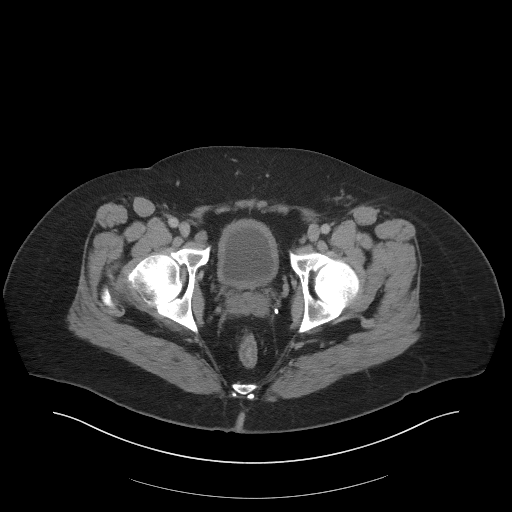
[im 37/115  soft-tissue]
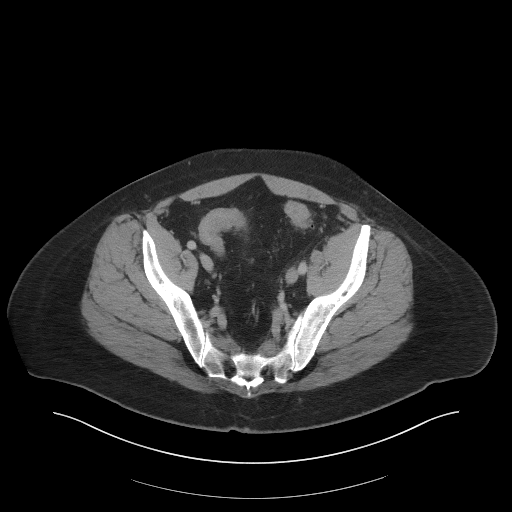
[im 43/115  soft-tissue]
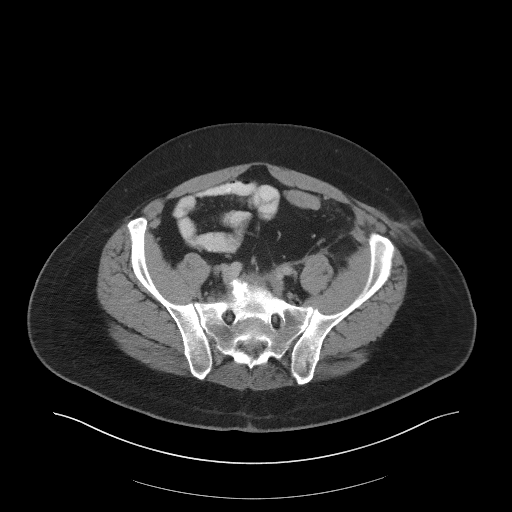
[im 55/115  soft-tissue]
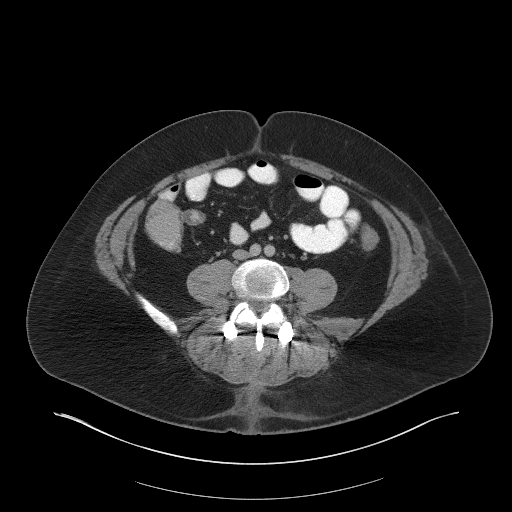
[im 61/115  soft-tissue]
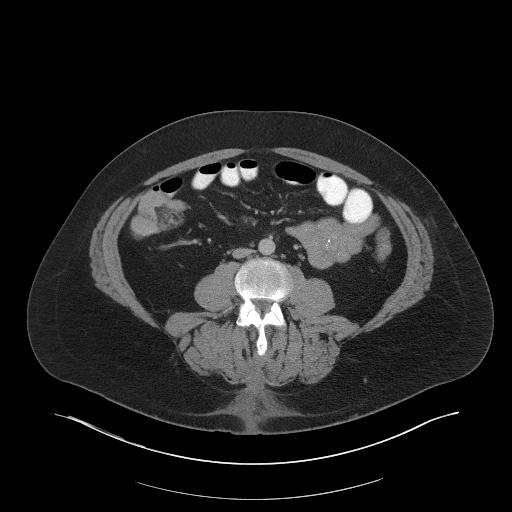
[im 73/115  soft-tissue]
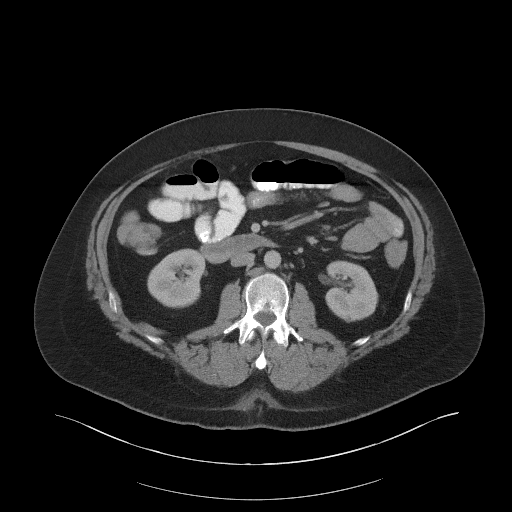
[im 79/115  soft-tissue]
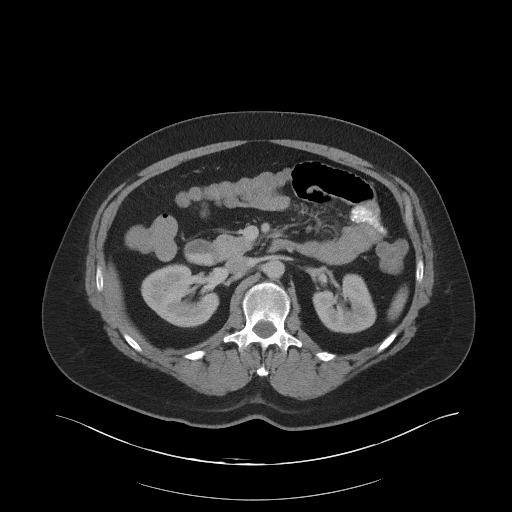
[im 79/115  bone]
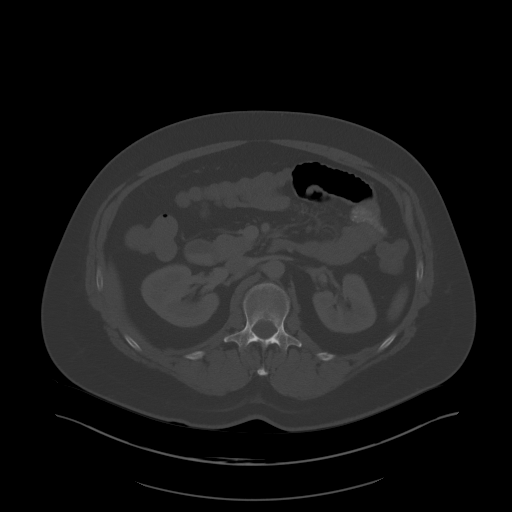
[im 91/115  soft-tissue]
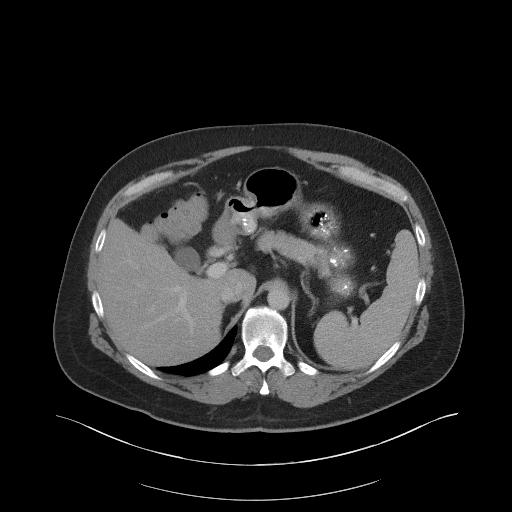
[im 97/115  soft-tissue]
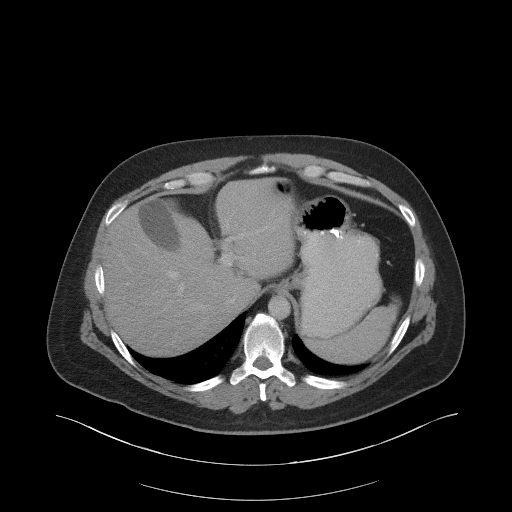
[im 109/115  soft-tissue]
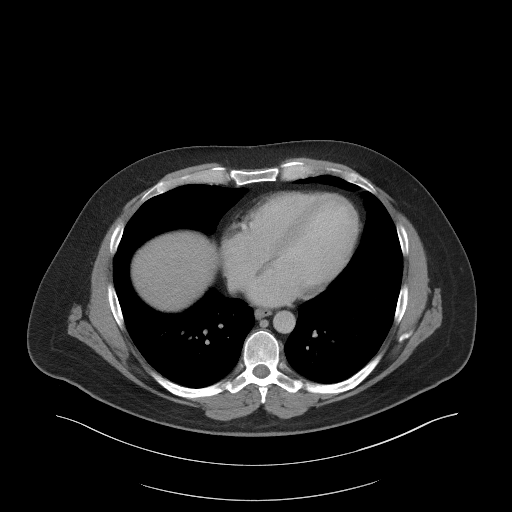

[Series 5: coronal st · coronal · 0.92mm/px · 3 of 109 slices shown]
[im 37/109  soft-tissue]
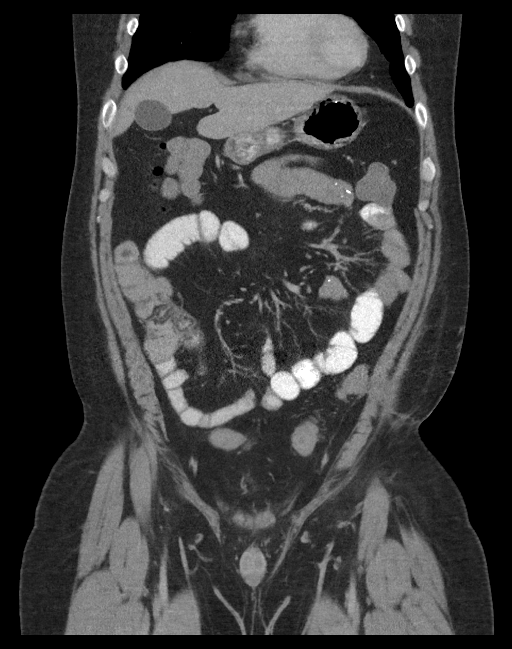
[im 49/109  soft-tissue]
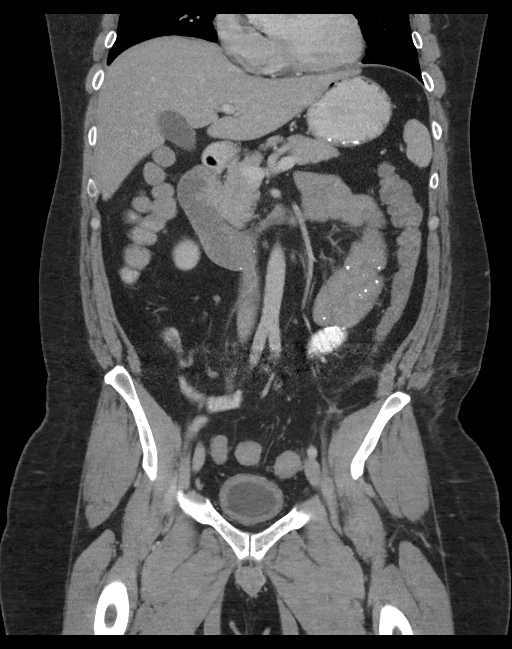
[im 61/109  soft-tissue]
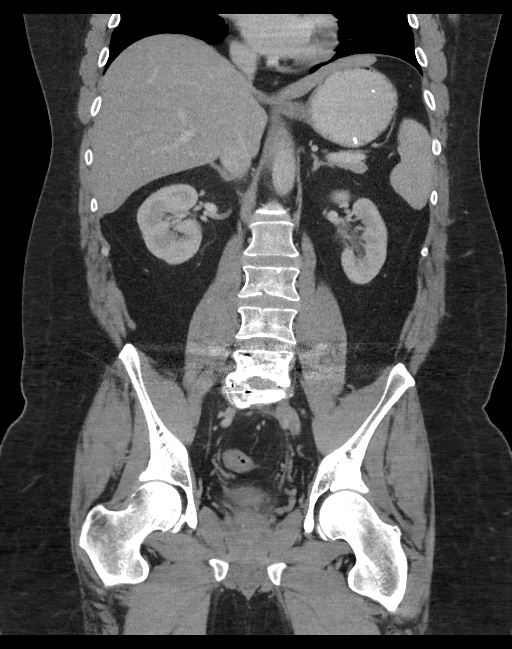

[15 of 46 positions shown; findings below may reference images not displayed]

FINDINGS: Lower chest: There is no demonstrable lung base edema or
consolidation.

Hepatobiliary: No focal liver lesions are apparent. Gallbladder wall
is not appreciably thickened. There is no biliary duct dilatation.

Pancreas: No pancreatic mass or inflammatory focus.

Spleen: No splenic lesions are evident.

Adrenals/Urinary Tract: Adrenals bilaterally appear unremarkable.
Kidneys bilaterally show no evident mass or hydronephrosis on either
side. There is no renal or ureteral calculus on either side. Urinary
bladder is midline with wall thickness increased diffusely.

Stomach/Bowel: There are occasional sigmoid diverticula. No evident
diverticulitis. There is no appreciable bowel wall or mesenteric
thickening. There is no appreciable bowel obstruction. There is no
free air or portal venous air.

Vascular/Lymphatic: There is no abdominal aortic aneurysm. No
vascular lesions are appreciable. There is no adenopathy in the
abdomen or pelvis.

Reproductive: There are prostatic calculi present. Prostate and
seminal vesicles appear normal in size and contour. No pelvic mass
evident.

Other: Appendix appears normal. There is no abscess or ascites
evident in the abdomen or pelvis.

Musculoskeletal: There is postoperative change at L5 and S1.
Previous disc spacer at L5-S1 is no longer evident. There is
irregularity along the inferior aspect of the L5 vertebral body, a
finding not present previously. Other bony structures appear intact.
No intramuscular or abdominal wall lesions are evident.
IMPRESSION: 1. Postoperative change at L5 and S1. Disc spacer no longer
appreciable at L5-S1. Irregularity is noted along the inferior
aspect of the L5 vertebral body. While this appearance may be of
postoperative etiology, the appearance on CT is concerning for
possible degree of discitis and osteomyelitis involving the inferior
aspect of the L5 vertebral body.

2. Diffuse urinary bladder wall thickening, a finding concerning for
a degree of cystitis.

3. Occasional sigmoid diverticula without diverticulitis. No bowel
obstruction. No abscess in the abdomen or pelvis. Appendix appears
normal.

4. No renal or ureteral calculi. No hydronephrosis. There are
prostatic calculi.

## 2020-06-13 NOTE — ED Provider Notes (Signed)
Formatting of this note is different from the original.    Ronald Garza    ED Provider Note    Ronald Garza 44 y.o. male DOB: 06/28/1976 MRN: [id number]  History     Chief Complaint   Patient presents with   ? Abdominal Pain     pain started about five days ago described as pinching and cramping pain    ? Nausea     N/D started on 8th of november and [place] sent him over    ? Diarrhea     Presents for persistent nausea and diarrhea with worsening abdominal pain.  Patient states that his nausea and diarrhea started on November 7 or 8.  He was seen by the [place] a few days later and blood work looked good at that time.  Patient states he has had decreased p.o. intake due to the nausea.  He states his diarrhea is generally improved but he has not taken much in.  Patient is on chronic suppressive antibiotics, doxycycline twice daily, for prior infected spinal hardware.  Patient states that he has not taken his antibiotics in 2 weeks.  Patient states that his abdominal pain started about 5 days ago in the left lower quadrant was originally cramping in nature but is become much more severe, no relief with his chronic OxyContin that he takes 4 times a day.  Patient denies fevers.  No history of C. difficile.  Patient states he is unable to provide a stool sample at this time.  Patient states that he has unintentionally lost 28 pounds in 3 weeks.  Patient reports a history of IBS that normally manifest as diarrhea although not this severe.    History provided by:  Patient and medical records  Abdominal Pain  Pain location:  LLQ  Pain quality:  Sharp  Pain radiates to:  Does not radiate  Pain severity:  7/10  Onset quality:  Gradual  Duration:  5 days  Timing:  Constant  Progression:  Worsening  Chronicity:  New  Context: not awakening from sleep, no recent illness, no retching and no sick contacts    Relieved by:  Nothing  Ineffective treatments: OxyContin.  Associated symptoms: anorexia, diarrhea and  nausea    Associated symptoms: no chest pain, no chills, no constipation, no dysuria, no fever, no hematuria, no shortness of breath and no vomiting    Risk factors: no recent hospitalization      Past Medical History:   Diagnosis Date   ? Diverticulitis      Past Surgical History:   Procedure Laterality Date   ? Abdominal surgery       Social History     Substance and Sexual Activity   Alcohol Use Not Currently     Social History     Tobacco Use   Smoking Status Never Smoker   Smokeless Tobacco Never Used     E-Cigarettes   ? Vaping Use Never User    ? Start Date     ? Cartridges/Day     ? Quit Date       Social History     Substance and Sexual Activity   Drug Use Never           Not on File    Discharge Medication List as of 06/13/2020  5:47 PM     CONTINUE these medications which have NOT CHANGED    Details   buprenorphine (SUBUTEX) 2 mg SUBL SMARTSIG:1 Tablet(s) Sublingual  Twice Daily PRN, Historical Med     cyclobenzaprine (FLEXERIL) 10 mg tablet Take 10 mg by mouth with meals as needed., Starting Mon 01/02/2020, Historical Med     Naloxone HCl 4 MG/0.1ML LIQD nasal spray SPRAY 1 SPRAY INTO ONE NOSTRIL AS DIRECTED AS NEEDED FOR OPIOID OVERDOSE (TURN PERSON ON SIDE AFTER DOSE. IF NO RESPONSE IN 2-3 MINUTES OR PERSON RESPONDS BUT RELAPSES, REPEAT USING A NEW SPRAY DEVICE AND SPRAY INTO THE OTHER NOSTRIL. CALL 911 AFTER USE. ) * EMERGENCY USE ONLY *, Historical Med     oxyCODONE HCl (ROXICODONE) 10 mg immediate release tablet Take 10 mg by mouth every 4 (four) hours as needed., Starting Thu 08/04/2019, Historical Med     SUMAtriptan succinate (IMITREX) 100 mg tablet TAKE ONE TABLET BY MOUTH AS DIRECTED (FIRST DOSE AT ONSET OF HEADACHE,THEN REPEAT AFTER 2 HOURS IF NO RELIEF-NO MORE THAN 200 MG PER DAY), Historical Med     albuterol sulfate HFA (PROVENTIL,VENTOLIN,PROAIR) 108 (90 Base) MCG/ACT inhaler Inhale into the lungs., Historical Med         Review of Systems     Review of Systems   Constitutional: Negative for  chills and fever.   HENT: Negative for congestion.    Respiratory: Negative for shortness of breath.    Cardiovascular: Negative for chest pain.   Gastrointestinal: Positive for abdominal pain, anorexia, diarrhea and nausea. Negative for constipation and vomiting.   Genitourinary: Negative for dysuria and hematuria.   Musculoskeletal: Negative for myalgias.   Skin: Negative for rash.   Allergic/Immunologic: Negative for immunocompromised state.   Neurological: Negative for light-headedness and headaches.   Psychiatric/Behavioral: Negative for confusion.     Physical Exam     ED Triage Vitals [06/13/20 1241]   BP 111/87   Heart Rate 101   Resp 20   SpO2 97 %   Temp 98.6 F (37 C)     Physical Exam   Nursing note and vitals reviewed.  Constitutional: He appears well-developed and well-nourished. He does not appear distressed and does not appear ill. Not diaphoretic.  Seated upright on stretcher, no acute distress.   HENT:   Head: Normocephalic and atraumatic.   Eyes: EOM are intact.   Neck: Normal range of motion. Neck supple.   Cardiovascular: Normal rate and regular rhythm.   Pulmonary/Chest: Respiratory effort normal and breath sounds normal.   Abdominal: Soft. There is abdominal tenderness in the left lower quadrant. There is no guarding. Abdomen not distended.   Musculoskeletal: Normal range of motion.      Cervical back: Normal range of motion and neck supple.     Neurological: He is alert and oriented to person, place, and time. Gait normal.   Skin: Skin is warm. Not diaphoretic. Skin is dry.   Psychiatric: He has a normal mood and affect. His behavior is normal.     ED Course     Lab results:    CBC AND DIFFERENTIAL - Abnormal       Result Value    WBC 11.8 (*)     RBC 6.00 (*)     HGB 14.6      HCT 46.7      MCV 78 (*)     MCH 24.3 (*)     MCHC 31.3 (*)     Plt Ct 333      RDW SD 40.5      MPV 9.6      NRBC% 0.0  NRBC 0.000      NEUTROPHIL % 82.3 (*)     LYMPHOCYTE % 8.6 (*)     MONOCYTE % 8.0       Eosinophil % 0.3 (*)     BASOPHIL % 0.3      IG% 0.500 (*)     ABSOLUTE NEUTROPHIL COUNT 9.67 (*)     ABSOLUTE LYMPHOCYTE COUNT 1.0      MONO ABSOLUTE 0.9 (*)     EOS ABSOLUTE 0.0      BASO ABSOLUTE 0.0      IG ABSOLUTE 0.060 (*)    COMPREHENSIVE METABOLIC PANEL - Abnormal    Na 137      Potassium 4.0      Cl 96 (*)     CO2 27      AGAP 14      Glucose 142 (*)     BUN 12      Creatinine 1.09      Ca 9.9      ALK PHOS 113      T Bili 0.49      Total Protein 8.1      Alb 4.3      GLOBULIN 3.8      ALBUMIN/GLOBULIN RATIO 1.1      BUN/CREAT RATIO 11.0      ALT 11      AST 11      GFR AFRICAN AMERICAN 95      Comment: African-American:   Normal GFR (glomerular filtration rate) > 60 mL/min/1.73 meters squared. < 60 may include impaired kidney function based on creatinine, age, legal sex, and race normalized to accepted average body surface area     GFR Non African American 82      Comment: Non African-American:   Normal GFR (glomerular filtration rate) > 60 mL/min/1.73 meters squared. < 60 may include impaired kidney function based on creatinine, age, legal sex, and race normalized to accepted average body surface area    UA NEGATIVE POPULATION, POS SYMPTOMS - Abnormal    Urine Color Yellow      Urine Appearance Clear      Urine Specific Gravity 1.025      Urine pH 6.0      Urine Protein - Dipstick 50  (*)     Urine Glucose Negative      Urine Ketones 100  (*)     Urine Bilirubin 1  (*)     Urine Blood Negative      Urine Nitrite Negative      Urine Urobilinogen 3  (*)     Urine Leukocyte Esterase Negative      Urine Squamous Epithelial Cells 0-2      Urine WBC 0-2      Urine Bacteria Trace      Urine Mucous 1+  (*)     Urine Granular Casts 0-2 (*)     Narrative:     Does not meet criteria for reflex to Urine Culture.   LIGHT BLUE TOP   GOLD SST     Imaging:    CT ABDOMEN PELVIS W IV CONTRAST    Narrative:     INDICATION: LLQ pain,    TECHNIQUE: Axial CT through the abdomen and pelvis. Radiation dose reduction was  utilized (automated exposure control, mA or kV adjustment based on patient size, or iterative image reconstruction).    COMPARISON: No prior abdominal and pelvic CTs. There is a CT of the lumbosacral spine from 08/04/2018  FINDINGS:    #  Liver: Liver is without biliary ductal dilatation or mass.  #  Gallbladder: Nondistended  #  Spleen: Normal  #  Pancreas: Normal  #  Adrenal Glands: Normal  #  Kidneys and Bladder: Kidneys are without hydronephrosis or mass.  #  Retroperitoneum: The patient has had prior fusion at L5-S1 with pedicle screws and rods in place. There is increased soft tissue anterior to the L5-S1 disc space with stranding in the fat that has increased since 08/04/2022 inflammation. No fluid collection present to suggest abscess.  #  GI Tract: No bowel dilatation or bowel wall thickening. No pneumatosis, free air, or free fluid.  #  Vascular: No aneurysm.  #  Pelvis: Bladder is decompressed with prominent bladder wall.  #  Lung Bases: Normal  #  Musculoskeletal: There has been a prior fusion procedure at L5-S1 with pedicle screws and rods in place. The appearance is similar to a 19 2020. No bony destruction is evident. There is soft tissue density with stranding in the fat anterior to the L5-S1 disc space suspicious for inflammation. No fluid collection to suggest abscess.     Impression:     IMPRESSION:   1. Prior fusion procedure L5-S1. There is increased soft tissue density and stranding in the fat anterior to the L5-S1 disc space suspicious for inflammation. No fluid collection is present to suggest abscess. No bony or endplate destruction has developed.  2. The urinary bladder is decompressed and the wall of the bladder is mildly thickened raising the possibility of cystitis which could be chronic or acute.    Electronically Signed by: Danella Deis     ECG:  ECG Results    None                          Pre-Sedation  Procedures      MDM  Number of Diagnoses or Management Options  Diarrhea,  unspecified type  Left lower quadrant abdominal pain  Diagnosis management comments: Afebrile.  Hemodynamically stable and nontoxic-appearing. Unable to provide stool sample in ED. Tolerating PO intake. Labs as above.  Leukocytosis 11.8.  Labs otherwise reassuring.  CT negative for any intra-abdominal process to explain pain.  CT notes increased soft tissue density anterior to L5-S1 to space.  Compared to previous CT, in care everywhere, which notes similar inflammation, baseline for patient.   Advise close follow-up with gastroenterology for further care.  Patient return to emergency department for worsening symptoms.  Patient voiced understanding and agreement with plan.  No further concerns at this time.    Coding    Provider Communication    Discharge Medication List as of 06/13/2020  5:47 PM     START taking these medications    Details   promethazine (PHENERGAN) 25 MG tablet Take one tablet (25 mg dose) by mouth every 8 (eight) hours as needed for Nausea., Starting Wed 06/13/2020, Print         Discharge Medication List as of 06/13/2020  5:47 PM       Discharge Medication List as of 06/13/2020  5:47 PM       Clinical Impression     Final diagnoses:   Diarrhea, unspecified type   Left lower quadrant abdominal pain     ED Disposition     ED Disposition Comment    Discharge                 Follow-up  Information     Schedule an appointment as soon as possible for a visit  with Reubin Milan, MD.    Specialty: Internal Medicine  Contact information:  [address] [place] [address]  [phone]         [facility] Emergency Department.    Specialty: Emergency Medicine  Comments: If symptoms worsen  Contact information:  [address] [place]  [phone]                Electronically signed by:    Raquel James, MD  06/15/20 [phone]    Electronically signed by Raquel James, MD at 06/15/2020  9:38 AM EST

## 2020-06-13 NOTE — ED Notes (Signed)
Formatting of this note might be different from the original.  Form faxed to [phone] per MD Caudill's request for L-spine results.    Electronically signed by Linus Orn, RN at 06/13/2020  5:35 PM EST

## 2021-07-26 NOTE — Telephone Encounter (Signed)
Formatting of this note might be different from the original.                               OPAT NOTE    Lab date: 07/23/2021    Diagnosis: Osteomyelitis    Abx therapy: Per HIM note on 06/05/2021 within the Assessment - Back Pain section.    -The patient was going to be d/c on daptomycin however he is [facility].  The [facility] is requested their infectious disease physician to follow-up.  After discussion with Dr. Blossom Hoops (Infectious Disease, [facility] telephone # [phone]) stated everything has been approved from their end however the patient will be switched to cefazolin rather than Daptomycin based on the sensitivities of the cultures obtained from his lumbar spine.    EOT date: 07/26/2021    Home Health: [facility] [phone]    Infusion Company: [facility] [phone]    Abnormal labs:    CRP 1.4 (0.5-0.5) 0.9 on 12/6, 0.7 on 11/29, 136.2 on 11/10    Interventions: Sent to the provider to review, completing today 07/26/2020, removed from OPAT.     Marland Kitchen          Electronically signed by Earnestine Mealing, M.S.N., R.N. at 07/26/2021  3:16 PM CST

## 2022-02-21 NOTE — ED Notes (Signed)
Formatting of this note might be different from the original.  Pt arrived to ED with complaint of Low back pain x 5 days. Pt states he has appt with pain mgmt and has ran out of meds. Pt complains of nausea and diarrhea due to withdrawal of pain medication. VSS in triage.   Electronically signed by Eusebio Me, RN at 02/21/2022  7:28 PM EDT

## 2022-02-22 NOTE — ED Provider Notes (Signed)
Formatting of this note is different from the original.    ED Provider Note  Patient Name: Ronald Garza                                                        MRN: [id number]  DOB: 10/10/1975  SERVICE DATE: 02/21/22    History     Patient presents with:  Low Back Pain: X 5 days     46 year old male presents emergency department with low back pain worse in the last 5 days.  States he just moved here from [place] approximately 1 month ago and with moving he has exacerbated his low back pain.  Denies any saddle anesthesia.  Denies any falls/trauma.  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  Normally takes oxycodone 10 mg every 4 hours and Suboxone.  States he is off of his Suboxone x6 days as he has been unable to establish care here in [place].  PMH-sepsis, low back pain, spinal fusions.  Denies any urinary complaints.  No chest pain or shortness of breath.  No fever.  Here for further evaluation and management.    History provided by:  Medical records and patient  Language interpreter used: No      No past medical history on file.    No past surgical history on file.    No family history on file.    Social History     Tobacco Use   ? Smoking status: Former     Types: Cigarettes     Passive exposure: Never   ? Smokeless tobacco: Never   Substance and Sexual Activity   ? Alcohol use: Not Currently     Comment: quit 2005   ? Drug use: Never   ? Sexual activity: Not on file     ALLERGIES   Allergen Reactions   ? Z Pak [Azithromycin] Anaphylaxis     Review of Systems   Constitutional:  Negative for chills and fever.   HENT:  Negative for sore throat and trouble swallowing.    Eyes:  Negative for visual disturbance.   Respiratory:  Negative for chest tightness and shortness of breath.    Cardiovascular:  Negative for chest pain and palpitations.   Gastrointestinal:  Negative for abdominal pain, constipation, diarrhea, nausea and  vomiting.   Genitourinary:  Negative for difficulty urinating, dysuria and urgency.   Musculoskeletal:  Positive for back pain. Negative for gait problem, joint swelling and neck pain.   Skin:  Negative for color change.   Neurological:  Negative for dizziness and syncope.   Hematological:  Does not bruise/bleed easily.   Psychiatric/Behavioral:  Negative for behavioral problems.      Physical Exam     Vitals [02/21/22 1925]   BP Pulse Temp Temp src Resp SpO2 Weight Height   146/108 86 36.5 C (97.7 F) Temporal 20 97 % 117 kg (258 lb) --     Physical Exam  Vitals and nursing note reviewed.   Constitutional:       General: He is awake. He is not in acute distress.     Appearance: Normal appearance.   HENT:      Head: Normocephalic and atraumatic.  Mouth/Throat:      Mouth: Mucous membranes are moist.   Eyes:      Extraocular Movements: Extraocular movements intact.      Pupils: Pupils are equal, round, and reactive to light.   Cardiovascular:      Rate and Rhythm: Regular rhythm. Tachycardia present.      Pulses: Normal pulses.      Heart sounds: Normal heart sounds.   Pulmonary:      Effort: Pulmonary effort is normal. No respiratory distress.      Breath sounds: Normal breath sounds.   Abdominal:      General: Bowel sounds are normal. There is no distension.      Palpations: Abdomen is soft.      Tenderness: There is no abdominal tenderness. There is no right CVA tenderness or left CVA tenderness.   Musculoskeletal:         General: Tenderness present. No deformity or signs of injury. Normal range of motion.      Cervical back: Normal range of motion.      Thoracic back: Spasms and tenderness present. No bony tenderness.      Lumbar back: Tenderness present. No bony tenderness.        Back:       Comments: Low back pain.   Skin:     General: Skin is warm and dry.      Capillary Refill: Capillary refill takes less than 2 seconds.   Neurological:      General: No focal deficit present.      Mental Status: He is  alert and oriented to person, place, and time.   Psychiatric:         Mood and Affect: Mood normal.         Behavior: Behavior normal. Behavior is cooperative.               Diagnostic Testing   ED Labs Ordered and Reviewed - No data to display    Procedures    ED Course / Clinical Impression     Clinical Impressions as of 02/22/22 0659   Chronic bilateral low back pain without sciatica             MDM / Disposition / Plan             MDM:  Vital signs reviewed.  Triage records were reviewed.  Medical records were reviewed.  Nursing notes were reviewed and incorporated.    HPI- 46 year old male presents emergency department with low back pain worse in the last 5 days.   PE significant for:  Low back pain     DDX - Differentials including but not limited to MSK, infection, UTI, pyelonephritis,    Labs:  CBC  CMP  Mag  UDS  UA    Radiology:  CT lumbar  CT thoracic    Patient seen and evaluated emergency department.  Patient has a detailed story of prior spinal fusions in the past.  States it involves of 5 through S1.  States he is currently in need of another, third spinal fusion but has recently relocated here from [place] 1 month ago.  States he has been moving boxes and feels as if his pain has exacerbated.  Reported feeling "crunchiness" in his thoracic spine.  Was taking Suboxone up until approximately 6 days ago.  Also reports taking oxycodone 10 mg every 4 hours and states he took his last dose this morning.  Here for further evaluation and management.  I did explain treating chronic pain in the emergency department.  I did offer him further testing such as blood work and T/L spine CAT scans.  Patient was agreement with this plan of care.  I was then notified that patient wanted to elope as "we were unable to help him."  Patient did not wish to proceed with testing.  I also offered him an appointment with pain management/spine health.  This point patient would like to leave and is not interested in  signing/attaining paperwork.  Patient is marked eloped.    Disposition:  Eloped    Monico Hoar, APRN.CNP    This note was dictated using Dragon speech recognition software and may contain some errors that were a result of the program not accurately transcribing what was dictated.    History and Record Review    External record(s) reviewed: prior outpatient record, CareEverywhere and PDMP reviewed.   Findings from review of outpatient records: PMH  Findings from review of CareEverywhere records: [place] [facility]    SIGNATURE:  Monico Hoar, APRN.CNP      Monico Hoar  02/22/22 0044    Electronically signed by Monico Hoar, APRN.CNP at 02/22/2022 12:44 AM EDT

## 2022-02-22 NOTE — ED Notes (Signed)
Formatting of this note might be different from the original.   Patient requesting to leave and went in to speak to the patient regarding the plan per providers orders. Patient no longer in room and provider made aware.   Electronically signed by Meriel Pica, RN at 02/22/2022 12:34 AM EDT

## 2022-04-04 ENCOUNTER — Inpatient Hospital Stay
Admit: 2022-04-04 | Discharge: 2022-04-04 | Disposition: A | Discharge status: Home / Self Care | Payer: PRIVATE HEALTH INSURANCE

## 2022-04-04 ENCOUNTER — Emergency Department: Admit: 2022-04-04 | Payer: PRIVATE HEALTH INSURANCE | Primary: Internal Medicine

## 2022-04-04 DIAGNOSIS — R000 Tachycardia, unspecified: Secondary | ICD-10-CM

## 2022-04-04 LAB — CBC WITH AUTO DIFFERENTIAL
Basophils %: 0.2 %
Basophils Absolute: 0 10*3/uL (ref 0.0–0.2)
Eosinophils %: 0.1 %
Eosinophils Absolute: 0 10*3/uL (ref 0.0–0.7)
Hematocrit: 48.7 % (ref 42.0–52.0)
Hemoglobin: 16.3 g/dL (ref 14.0–18.0)
Lymphocytes %: 15.6 %
Lymphocytes Absolute: 2.2 10*3/uL (ref 1.0–4.8)
MCH: 27.2 pg (ref 27.0–31.3)
MCHC: 33.4 % (ref 33.0–37.0)
MCV: 81.6 fL (ref 79.0–92.2)
Monocytes %: 4.3 %
Monocytes Absolute: 0.6 10*3/uL (ref 0.2–0.8)
Neutrophils %: 79.8 %
Neutrophils Absolute: 11.5 10*3/uL — ABNORMAL HIGH (ref 1.4–6.5)
Platelets: 345 10*3/uL (ref 130–400)
RBC: 5.98 M/uL (ref 4.70–6.10)
RDW: 15.2 % — ABNORMAL HIGH (ref 11.5–14.5)
WBC: 14.3 10*3/uL — ABNORMAL HIGH (ref 4.8–10.8)

## 2022-04-04 LAB — COMPREHENSIVE METABOLIC PANEL
ALT: 28 U/L (ref 0–41)
AST: 17 U/L (ref 0–40)
Albumin: 5 g/dL — ABNORMAL HIGH (ref 3.5–4.6)
Alkaline Phosphatase: 92 U/L (ref 35–104)
Anion Gap: 15 mEq/L (ref 9–15)
BUN: 12 mg/dL (ref 6–20)
CO2: 25 mEq/L (ref 20–31)
Calcium: 9.4 mg/dL (ref 8.5–9.9)
Chloride: 102 mEq/L (ref 95–107)
Creatinine: 0.85 mg/dL (ref 0.70–1.20)
Est, Glom Filt Rate: >60 (ref >60)
Globulin: 3 g/dL (ref 2.3–3.5)
Glucose: 132 mg/dL — ABNORMAL HIGH (ref 70–99)
Potassium: 3.7 mEq/L (ref 3.4–4.9)
Sodium: 142 mEq/L (ref 135–144)
Total Bilirubin: 0.3 mg/dL (ref 0.2–0.7)
Total Protein: 8 g/dL (ref 6.3–8.0)

## 2022-04-04 LAB — URINE DRUG SCREEN
Amphetamine Screen, Urine: NEGATIVE (ref <1000)
Barbiturate Screen, Ur: NEGATIVE (ref <200)
Benzodiazepine Screen, Urine: NEGATIVE (ref <200)
Cannabinoid Scrn, Ur: NEGATIVE (ref <50)
Cocaine Metabolite Screen, Urine: NEGATIVE (ref <300)
FENTANYL SCREEN, URINE: NEGATIVE (ref <50)
Methadone Screen, Urine: NEGATIVE (ref <300)
Opiate Scrn, Ur: NEGATIVE (ref <300)
Oxycodone Urine: NEGATIVE (ref <100)
PCP Screen, Urine: NEGATIVE (ref <25)
Propoxyphene Scrn, Ur: NEGATIVE (ref <300)

## 2022-04-04 LAB — URINALYSIS WITH REFLEX TO CULTURE
Bilirubin Urine: NEGATIVE
Blood, Urine: NEGATIVE
Glucose, Ur: NEGATIVE mg/dL
Leukocyte Esterase, Urine: NEGATIVE
Nitrite, Urine: NEGATIVE
Protein, UA: NEGATIVE mg/dL
Specific Gravity, UA: 1.027 (ref 1.005–1.030)
Urobilinogen, Urine: 0.2 E.U./dL (ref <2.0)
pH, UA: 5.5 (ref 5.0–9.0)

## 2022-04-04 LAB — PROTIME-INR
INR: 1
Protime: 13 s (ref 12.3–14.9)

## 2022-04-04 LAB — APTT: aPTT: 24 s — ABNORMAL LOW (ref 24.4–36.8)

## 2022-04-04 LAB — TSH: TSH: 1.18 u[IU]/mL (ref 0.440–3.860)

## 2022-04-04 LAB — POCT CREATININE: POC CREATININE WHOLE BLOOD: 0.6

## 2022-04-04 LAB — BRAIN NATRIURETIC PEPTIDE: Pro-BNP: 56 pg/mL

## 2022-04-04 LAB — MAGNESIUM: Magnesium: 2.3 mg/dL (ref 1.7–2.4)

## 2022-04-04 LAB — TROPONIN: Troponin: <0.01 ng/mL (ref 0.000–0.010)

## 2022-04-04 MED ORDER — IOPAMIDOL 61 % IV SOLN
61 % | Freq: Once | INTRAVENOUS | Status: AC | PRN
Start: 2022-04-04 — End: 2022-04-04
  Administered 2022-04-04: 19:00:00 75 mL via INTRAVENOUS

## 2022-04-04 MED ORDER — DILTIAZEM HCL ER COATED BEADS 120 MG PO CP24
120 | ORAL_CAPSULE | Freq: Every day | ORAL | 0 refills | 90.00000 days | Status: AC
Start: 2022-04-04 — End: 2024-07-27

## 2022-04-04 MED ORDER — SODIUM CHLORIDE 0.9 % IV BOLUS
0.9 % | Freq: Once | INTRAVENOUS | Status: AC
Start: 2022-04-04 — End: 2022-04-04
  Administered 2022-04-04: 18:00:00 1000 mL via INTRAVENOUS

## 2022-04-04 MED FILL — SODIUM CHLORIDE 0.9 % IV SOLN: 0.9 % | INTRAVENOUS | Qty: 1000

## 2022-04-04 NOTE — ED Notes (Signed)
Creat 0.6 pt ready for CT      Vickii Chafe, RN  04/04/22 1406

## 2022-04-04 NOTE — ED Triage Notes (Signed)
Pt arrived with co tachycardia x for days.

## 2022-04-04 NOTE — ED Notes (Signed)
Ct ready

## 2022-04-04 NOTE — ED Provider Notes (Signed)
[facility] ED  EMERGENCY DEPARTMENT ENCOUNTER      Pt Name: Ronald Garza  MRN: [id number]  Birthdate May 02, 1976  Date of evaluation: 04/04/2022  Provider: Modena Slater, PA-C    CHIEF COMPLAINT       Chief Complaint   Patient presents with    Tachycardia     X four days          HISTORY OF PRESENT ILLNESS   (Location/Symptom, Timing/Onset, Context/Setting, Quality, Duration, Modifying Factors, Severity)  Note limiting factors.   Ronald Garza is a 46 y.o. male who presents to the emergency department for evaluation of tachycardia x4 days.  Patient states he tested positive for COVID-19 at the end of August.  Generally recovered with the exception of the residual dry cough.  He states over the last four days his heart rate has increased.  He states he normally has a resting heart rate around 60 however over the last 4 days has been in the 110 range, even up to 156. Heart rate seems to improve with rest and worsens whenever he gets up.  Associated jittery sensation as well as palpitations with the tachycardia. States he has been having DOE. He does have asthma but has not been wheezing. Used inhaler a few days ago. Pt unable to answer regarding orthopnea as he cannot lie flat 2/2 to hx of spinal fusion but states when he lies back he starts to cough. Reports substernal chest tightness last night when experiencing elevated HR and palpitations. No current chest pain. Contacted his primary care physician who recommended he be evaluated in the emergency department. Had a chest x-ray done on Monday which showed no acute process however he was not having symptoms then. Was prescribed tessalon and medrol dose pak prior to sx onset as well. No new medications, drug or alcohol use, supplement or stimulant use, caffeine use. Did notice a bit of lightheadedness a few days ago with the palpitations as well. No hx of DVT or PE. Pt states hx of chronic low back pain, anxiety and asthma. He denies fever, chills, current  cp or sob, abd pain, current dizziness or lightheadedness nausea vomiting diarrhea urinary symptoms leg swelling hemoptysis syncope neck pain.     HPI    Nursing Notes were reviewed.    REVIEW OF SYSTEMS    (2-9 systems for level 4, 10 or more for level 5)     Review of Systems   Constitutional:  Negative for chills, fatigue and fever.   HENT:  Negative for congestion.    Eyes:  Negative for photophobia.   Respiratory:  Positive for cough, chest tightness and shortness of breath. Negative for wheezing.    Cardiovascular:  Positive for palpitations. Negative for chest pain and leg swelling.   Gastrointestinal:  Negative for abdominal distention, abdominal pain, blood in stool, constipation, diarrhea, nausea and vomiting.   Genitourinary:  Negative for dysuria, frequency and hematuria.   Musculoskeletal:  Negative for myalgias.   Allergic/Immunologic: Negative for immunocompromised state.   Neurological:  Positive for light-headedness. Negative for dizziness, weakness and headaches.   All other systems reviewed and are negative.      Except as noted above the remainder of the review of systems was reviewed and negative.       PAST MEDICAL HISTORY   No past medical history on file.      SURGICAL HISTORY     No past surgical history on file.  CURRENT MEDICATIONS       Previous Medications    ALBUTEROL SULFATE HFA (PROVENTIL;VENTOLIN;PROAIR) 108 (90 BASE) MCG/ACT INHALER    Inhale 2 puffs into the lungs every 4 hours as needed    CETIRIZINE (ZYRTEC) 10 MG CHEWABLE TABLET    Take 1 tablet by mouth daily    DOXYCYCLINE HYCLATE (VIBRAMYCIN) 100 MG CAPSULE    Take 1 capsule by mouth 2 times daily    DULOXETINE (CYMBALTA) 60 MG EXTENDED RELEASE CAPSULE    Take 1 capsule by mouth 2 times daily    ONDANSETRON (ZOFRAN-ODT) 4 MG DISINTEGRATING TABLET    1 tablet    SUMATRIPTAN (IMITREX) 100 MG TABLET    1 tablet    SUMATRIPTAN SUCCINATE 6 MG/0.5ML SOSY INJECTION    Inject into the skin       ALLERGIES     Erythromycin and  Gabapentin    FAMILY HISTORY     No family history on file.       SOCIAL HISTORY       Social History     Socioeconomic History    Marital status: Married       SCREENINGS         Glasgow Coma Scale  Eye Opening: Spontaneous  Best Verbal Response: Oriented  Best Motor Response: Obeys commands  Glasgow Coma Scale Score: 15                     CIWA Assessment  BP: (!) 145/99  Pulse: 88                 PHYSICAL EXAM    (up to 7 for level 4, 8 or more for level 5)     ED Triage Vitals [04/04/22 1318]   BP Temp Temp Source Pulse Respirations SpO2 Height Weight - Scale   (!) 151/100 98 F (36.7 C) Temporal (!) 108 18 98 % 6' (1.829 m) 245 lb (111.1 kg)       Physical Exam  Constitutional:       General: He is not in acute distress.     Appearance: He is well-developed. He is not ill-appearing, toxic-appearing or diaphoretic.   HENT:      Head: Normocephalic and atraumatic.      Right Ear: Tympanic membrane, ear canal and external ear normal.      Left Ear: Tympanic membrane, ear canal and external ear normal.      Nose: Nose normal.      Mouth/Throat:      Mouth: Mucous membranes are moist.   Eyes:      Pupils: Pupils are equal, round, and reactive to light.   Cardiovascular:      Rate and Rhythm: Regular rhythm. Tachycardia present.      Heart sounds: No murmur heard.     No friction rub. No gallop.   Pulmonary:      Effort: Pulmonary effort is normal.      Breath sounds: Normal breath sounds. No wheezing, rhonchi or rales.   Abdominal:      General: Bowel sounds are normal. There is no distension.      Palpations: Abdomen is soft. There is no mass.      Tenderness: There is no abdominal tenderness. There is no right CVA tenderness, left CVA tenderness, guarding or rebound.      Hernia: No hernia is present.   Musculoskeletal:         General: No swelling.  Cervical back: Normal range of motion.      Right lower leg: No edema.      Left lower leg: No edema.   Skin:     General: Skin is warm and dry.      Capillary  Refill: Capillary refill takes less than 2 seconds.      Findings: No rash.   Neurological:      General: No focal deficit present.      Mental Status: He is alert and oriented to person, place, and time.         DIAGNOSTIC RESULTS     EKG: All EKG's are interpreted by the Emergency Department Physician who either signs or Co-signs this chart in the absence of a cardiologist.    EKG shows sinus tachy with HR 109, normal axis, normal intervals, no ST changes.        RADIOLOGY:   Non-plain film images such as CT, Ultrasound and MRI are read by the radiologist. Plain radiographic images are visualized and preliminarily interpreted by the emergency physician with the below findings:        Interpretation per the Radiologist below, if available at the time of this note:    CTA CHEST W WO CONTRAST PE Eval   Final Result   No evidence of pulmonary embolism or acute pulmonary abnormality.      Mild cardiomegaly         XR CHEST PORTABLE   Final Result   No acute process.               ED BEDSIDE ULTRASOUND:   Performed by ED Physician - none    LABS:  Labs Reviewed   CBC WITH AUTO DIFFERENTIAL - Abnormal; Notable for the following components:       Result Value    WBC 14.3 (*)     RDW 15.2 (*)     Neutrophils Absolute 11.5 (*)     All other components within normal limits   COMPREHENSIVE METABOLIC PANEL - Abnormal; Notable for the following components:    Glucose 132 (*)     Albumin 5.0 (*)     All other components within normal limits   APTT - Abnormal; Notable for the following components:    aPTT 24.0 (*)     All other components within normal limits   URINALYSIS WITH REFLEX TO CULTURE - Abnormal; Notable for the following components:    Ketones, Urine TRACE (*)     All other components within normal limits   POCT CREATININE - Normal   PROTIME-INR   URINE DRUG SCREEN   TSH   MAGNESIUM   TROPONIN   BRAIN NATRIURETIC PEPTIDE       All other labs were within normal range or not returned as of this dictation.    EMERGENCY  DEPARTMENT COURSE and DIFFERENTIAL DIAGNOSIS/MDM:   Vitals:    Vitals:    04/04/22 1318 04/04/22 1500 04/04/22 1556   BP: (!) 151/100 137/89 (!) 145/99   Pulse: (!) 108  88   Resp: 18  11   Temp: 98 F (36.7 C)     TempSrc: Temporal     SpO2: 98% 96% 98%   Weight: 245 lb (111.1 kg)     Height: 6' (1.829 m)             Medical Decision Making  Amount and/or Complexity of Data Reviewed  Labs: ordered.  Radiology: ordered.    Risk  Prescription drug  management.      Patient is a 46 year old male presenting to the ED for evaluation of tachycardia x4 days.  He is afebrile and tachycardic to 108.  He was given 1 L IV normal saline in the ED. EKG shows sinus tachy with HR 109, normal axis, normal intervals, no ST changes.  CBC is remarkable for white count to 14.3, he is currently on a Medrol dose pack which could account for the elevation.  Remainder of labs are largely unremarkable.  Chest x-ray shows no acute intrathoracic process.  A CTA of the chest is negative for PE.  There is mild cardiomegaly.  Otherwise no acute intrathoracic process such as infiltrate effusion pneumothorax.  Heart rate improved to 80s with fluids at rest. Unclear etiology of tachycardia at this time, possible sequela of covid infection vs postural orthostatic tachycardia syndrome. Low concern for ACS, acute CHF, sepsis/infection, arrhthymias. PE ruled out. No anemia to suggest occult blood loss. Discussed case with Dr. Theodoro Grist of cardiology.  He recommends low-dose diltiazem 120 mg daily and follow-up in his office within the next week.  Referral placed.  Patient feels safe and comfortable going home as well at this time.  He was encouraged to return to the emergency department immediately for worsening or new symptoms given warning signs for which she should return.  Patient understands agrees to plan.      REASSESSMENT          CRITICAL CARE TIME   Total Critical Care time was 0 minutes, excluding separately reportable procedures.  There was a  high probability of clinically significant/life threatening deterioration in the patient's condition which required my urgent intervention.      CONSULTS:  None    PROCEDURES:  Unless otherwise noted below, none     Procedures        FINAL IMPRESSION      1. Tachycardia          DISPOSITION/PLAN   DISPOSITION Decision To Discharge 04/04/2022 03:56:11 PM      PATIENT REFERRED TO:  Deri Fuelling, MD  [address]  [place] [address]  [phone]    Schedule an appointment as soon as possible for a visit in 2 days      [facility] ED  [address]  [place] [address]  [phone]  Go to   As needed, If symptoms worsen    Harl Favor, MD  [address]  [address]  [place] [address]  [phone]            DISCHARGE MEDICATIONS:  New Prescriptions    DILTIAZEM (CARDIZEM CD) 120 MG EXTENDED RELEASE CAPSULE    Take 1 capsule by mouth daily     Controlled Substances Monitoring:          No data to display                (Please note that portions of this note were completed with a voice recognition program.  Efforts were made to edit the dictations but occasionally words are mis-transcribed.)    Modena Slater, PA-C (electronically signed)             Modena Slater, PA-C  04/04/22 1616

## 2022-04-05 LAB — POCT VENOUS
Est, Glom Filt Rate: >60 (ref >60)
POC Creatinine: 0.8 mg/dL (ref 0.8–1.3)

## 2022-04-06 LAB — EKG 12-LEAD
Atrial Rate: 109 {beats}/min
P Axis: 38 degrees
P-R Interval: 140 ms
Q-T Interval: 334 ms
QRS Duration: 90 ms
QTc Calculation (Bazett): 449 ms
R Axis: 28 degrees
T Axis: 21 degrees
Ventricular Rate: 109 {beats}/min

## 2022-04-08 ENCOUNTER — Encounter
Admit: 2022-04-08 | Discharge: 2022-04-08 | Payer: TRICARE (CHAMPUS) | Attending: Cardiovascular Disease | Primary: Internal Medicine

## 2022-04-08 DIAGNOSIS — R079 Chest pain, unspecified: Secondary | ICD-10-CM

## 2022-04-08 NOTE — Progress Notes (Signed)
NEW PATIENT        Patient: Ronald Garza  Date of Birth: 05-17-1976  MRN: [id number]    Chief Complaint:  Chief Complaint   Patient presents with    Follow-Up from [place]: 9/15 for tachycardia       CV Data:    Subjective/HPI  recent ER for Tachycardia up to 160. Had COVID end of August and since then he noted symptoms. He has CP and SOB w with it. Also LH but no falls.  Recent K Mag, and TSH WNL.     EKG: SR 109 ( ER)     Former smoker quit 2005  No ETOH  Lives w wife   Retired IT trainer.   ++FH    No past medical history on file.    No past surgical history on file.    No family history on file.    Social History     Socioeconomic History    Marital status: Married     Spouse name: None    Number of children: None    Years of education: None    Highest education level: None   Tobacco Use    Smoking status: Former     Types: Cigarettes     Quit date: 2005     Years since quitting: 18.7    Smokeless tobacco: Never   Substance and Sexual Activity    Alcohol use: Not Currently    Drug use: Never       Allergies   Allergen Reactions    Erythromycin Anaphylaxis    Gabapentin Swelling       Current Outpatient Medications   Medication Sig Dispense Refill    benzonatate (TESSALON) 100 MG capsule 1 capsule      dilTIAZem (CARDIZEM CD) 120 MG extended release capsule Take 1 capsule by mouth daily 30 capsule 0    cetirizine (ZYRTEC) 10 MG chewable tablet Take 1 tablet by mouth daily      albuterol sulfate HFA (PROVENTIL;VENTOLIN;PROAIR) 108 (90 Base) MCG/ACT inhaler Inhale 2 puffs into the lungs every 4 hours as needed      doxycycline hyclate (VIBRAMYCIN) 100 MG capsule Take 1 capsule by mouth 2 times daily      DULoxetine (CYMBALTA) 60 MG extended release capsule Take 1 capsule by mouth 2 times daily      SUMAtriptan (IMITREX) 100 MG tablet 1 tablet      SUMAtriptan Succinate 6 MG/0.5ML SOSY injection Inject into the skin       No current facility-administered medications for this visit.       Review of  Systems:   Review of Systems   Constitutional: Negative.  Negative for diaphoresis and fatigue.   HENT: Negative.     Eyes: Negative.    Respiratory:  Positive for shortness of breath. Negative for cough, chest tightness, wheezing and stridor.    Cardiovascular:  Positive for chest pain and palpitations. Negative for leg swelling.   Gastrointestinal: Negative.  Negative for blood in stool and nausea.   Genitourinary: Negative.    Musculoskeletal: Negative.    Skin: Negative.    Neurological: Negative.  Negative for dizziness, syncope, weakness and light-headedness.   Hematological: Negative.    Psychiatric/Behavioral: Negative.           Physical Examination:    BP 130/82 (Site: Right Upper Arm, Position: Sitting, Cuff Size: Large Adult)   Pulse (!) 113   Ht 6' (1.829 m)  Wt 251 lb 12.8 oz (114.2 kg)   SpO2 97%   BMI 34.15 kg/m    Physical Exam   Constitutional: He appears healthy. No distress.   HENT:   Normal cephalic and Atraumatic   Eyes: Pupils are equal, round, and reactive to light.   Neck: Thyroid normal. No JVD present. No neck adenopathy. No thyromegaly present.   Cardiovascular: Normal rate, regular rhythm, normal heart sounds, intact distal pulses and normal pulses.   Pulmonary/Chest: Effort normal and breath sounds normal. He has no wheezes. He has no rales. He exhibits no tenderness.   Abdominal: Soft. Bowel sounds are normal. There is no abdominal tenderness.   Musculoskeletal:         General: No tenderness or edema. Normal range of motion.      Cervical back: Normal range of motion and neck supple.   Neurological: He is alert and oriented to person, place, and time.   Skin: Skin is warm. No cyanosis. Nails show no clubbing.       LABS:  CBC:   Lab Results   Component Value Date/Time    WBC 14.3 04/04/2022 01:45 PM    RBC 5.98 04/04/2022 01:45 PM    HGB 16.3 04/04/2022 01:45 PM    HCT 48.7 04/04/2022 01:45 PM    MCV 81.6 04/04/2022 01:45 PM    MCH 27.2 04/04/2022 01:45 PM    MCHC 33.4  04/04/2022 01:45 PM    RDW 15.2 04/04/2022 01:45 PM    PLT 345 04/04/2022 01:45 PM     Lipids:No results found for: "CHOL"  No results found for: "TRIG"  No results found for: "HDL"  No results found for: "LDLCHOLESTEROL", "LDLCALC"  No results found for: "LABVLDL", "VLDL"  No results found for: "CHOLHDLRATIO"  CMP:    Lab Results   Component Value Date/Time    NA 142 04/04/2022 01:45 PM    K 3.7 04/04/2022 01:45 PM    CL 102 04/04/2022 01:45 PM    CO2 25 04/04/2022 01:45 PM    BUN 12 04/04/2022 01:45 PM    CREATININE 0.8 04/04/2022 02:03 PM    CREATININE 0.85 04/04/2022 01:45 PM    LABGLOM >60 04/04/2022 02:03 PM    GLUCOSE 132 04/04/2022 01:45 PM    PROT 8.0 04/04/2022 01:45 PM    LABALBU 5.0 04/04/2022 01:45 PM    CALCIUM 9.4 04/04/2022 01:45 PM    BILITOT 0.3 04/04/2022 01:45 PM    ALKPHOS 92 04/04/2022 01:45 PM    AST 17 04/04/2022 01:45 PM    ALT 28 04/04/2022 01:45 PM     BMP:    Lab Results   Component Value Date/Time    NA 142 04/04/2022 01:45 PM    K 3.7 04/04/2022 01:45 PM    CL 102 04/04/2022 01:45 PM    CO2 25 04/04/2022 01:45 PM    BUN 12 04/04/2022 01:45 PM    LABALBU 5.0 04/04/2022 01:45 PM    CREATININE 0.8 04/04/2022 02:03 PM    CREATININE 0.85 04/04/2022 01:45 PM    CALCIUM 9.4 04/04/2022 01:45 PM    LABGLOM >60 04/04/2022 02:03 PM    GLUCOSE 132 04/04/2022 01:45 PM     Magnesium:    Lab Results   Component Value Date/Time    MG 2.3 04/04/2022 01:45 PM     TSH:  Lab Results   Component Value Date    TSH 1.180 04/04/2022  There is no problem list on file for this patient.      Medications Discontinued During This Encounter   Medication Reason    colestipol (COLESTID) 1 g tablet     mirtazapine (REMERON) 15 MG tablet     ondansetron (ZOFRAN-ODT) 4 MG disintegrating tablet     oxyCODONE-acetaminophen (PERCOCET) 10-325 MG per tablet     prazosin (MINIPRESS) 2 MG capsule     pregabalin (LYRICA) 75 MG capsule     traMADol (ULTRAM) 50 MG tablet     zolpidem (AMBIEN) 5 MG tablet        Modified  Medications    No medications on file       No orders of the defined types were placed in this encounter.      Assessment/Plan:    1. Chest pain, unspecified type     - Exercise stress test; Future  - Echo (TTE) Complete; Future    2. DOE (dyspnea on exertion)     - Echo (TTE) Complete; Future    3. Tachycardia     - Extended cardiac holter monitor (48 hrs - 15 days); Future       Counseling:  Heart Healthy Lifestyle, Low Salt Diet, Take Precautions to Prevent Falls, and Walk Daily    Return for AFTER TESTS.      Electronically signed by Harl Favor, MD on 04/08/2022 at 2:27 PM

## 2022-04-22 ENCOUNTER — Encounter

## 2022-05-06 ENCOUNTER — Inpatient Hospital Stay
Admit: 2022-05-06 | Discharge: 2022-05-14 | Payer: TRICARE (CHAMPUS) | Attending: Cardiovascular Disease | Primary: Internal Medicine

## 2022-05-06 ENCOUNTER — Inpatient Hospital Stay
Admit: 2022-05-06 | Discharge: 2022-05-12 | Payer: TRICARE (CHAMPUS) | Attending: Cardiovascular Disease | Primary: Internal Medicine

## 2022-05-06 DIAGNOSIS — R079 Chest pain, unspecified: Secondary | ICD-10-CM

## 2022-05-06 LAB — ECHO (TTE) COMPLETE (PRN CONTRAST/BUBBLE/STRAIN/3D)
AV Area by Peak Velocity: 2.5 cm2
AV Area by VTI: 2.3 cm2
AV Cusp Mmode: 1.8 cm
AV Mean Gradient: 3 mmHg
AV Mean Velocity: 0.8 m/s
AV Peak Gradient: 8 mmHg
AV Peak Velocity: 1.4 m/s
AV VTI: 30.6 cm
AV Velocity Ratio: 0.79
AVA/BSA Peak Velocity: 1.1 cm2/m2
AVA/BSA VTI: 1 cm2/m2
Body Surface Area: 2.4 m2
E/E' Lateral: 4.92
E/E' Ratio (Averaged): 6.02
E/E' Septal: 7.11
EF BP: 65 % (ref 55–100)
Fractional Shortening 2D: 33 % (ref 28–44)
IVSd: 1 cm (ref 0.6–1.0)
IVSs: 1.3 cm
LA Diameter: 3.6 cm
LA Size Index: 1.53 cm/m2
LA Volume A-L A4C: 50 mL (ref 18–58)
LA Volume A-L A4C: 53 mL (ref 18–58)
LA Volume A-L A4C: 58 mL (ref 18–58)
LA Volume A-L A4C: 61 mL — AB (ref 18–58)
LA Volume A/L: 59 mL
LA Volume Index A/L: 25 mL/m2 (ref 16–34)
LV E' Lateral Velocity: 13 cm/s
LV E' Septal Velocity: 9 cm/s
LV EDV A2C: 92 mL
LV EDV A4C: 119 mL
LV EDV BP: 108 mL (ref 67–155)
LV EDV Index A2C: 39 mL/m2
LV EDV Index A4C: 51 mL/m2
LV EDV Index BP: 46 mL/m2
LV ESV A2C: 30 mL
LV ESV A4C: 44 mL
LV ESV BP: 38 mL (ref 22–58)
LV ESV Index A2C: 13 mL/m2
LV ESV Index A4C: 19 mL/m2
LV ESV Index BP: 16 mL/m2
LV Ejection Fraction A2C: 67 %
LV Ejection Fraction A4C: 63 %
LV Mass 2D Index: 72.4 g/m2 (ref 49–115)
LV Mass 2D: 170.2 g (ref 88–224)
LV RWT Ratio: 0.42
LVIDd Index: 2.04 cm/m2
LVIDd: 4.8 cm (ref 4.2–5.9)
LVIDs Index: 1.36 cm/m2
LVIDs: 3.2 cm
LVOT Area: 3.1 cm2
LVOT Diameter: 2 cm
LVOT Mean Gradient: 2 mmHg
LVOT Peak Gradient: 5 mmHg
LVOT Peak Velocity: 1.1 m/s
LVOT SV: 69.4 ml
LVOT Stroke Volume Index: 29.5 mL/m2
LVOT VTI: 22.1 cm
LVOT:AV VTI Index: 0.72
LVPWd: 1 cm (ref 0.6–1.0)
LVPWs: 1.3 cm
MV A Velocity: 0.76 m/s
MV Area by VTI: 2.9 cm2
MV E Velocity: 0.64 m/s
MV E Wave Deceleration Time: 212.1 ms
MV E/A: 0.84
MV Max Velocity: 0.7 m/s
MV Mean Gradient: 1 mmHg
MV Mean Velocity: 0.4 m/s
MV Peak Gradient: 2 mmHg
MV VTI: 24 cm
MV:LVOT VTI Index: 1.09
PV Max Velocity: 1.3 m/s
PV Peak Gradient: 7 mmHg
RVIDd: 3.6 cm
TAPSE: 2 cm (ref 1.7–?)
TR Max Velocity: 2.32 m/s
TR Peak Gradient: 22 mmHg

## 2022-05-06 LAB — STRESS TEST ONLY EXERCISE
Baseline Diastolic BP: 99 mmHg
Baseline HR: 88 {beats}/min
Baseline ST Depression: 0 mm
Baseline Systolic BP: 159 mmHg
Body Surface Area: 2.4 m2
Exercise Duration Time: 6 min
Exercuse Duration Seconds: 38 s
Stress Diastolic BP: 93 mmHg
Stress Estimated Workload: 8.9 METS
Stress Peak HR: 179 {beats}/min
Stress Percent HR Achieved: 103 %
Stress Rate Pressure Product: 33115 BPM*mmHg
Stress ST Depression: 0 mm
Stress Systolic BP: 185 mmHg
Stress Target HR: 174 {beats}/min

## 2022-05-13 NOTE — Telephone Encounter (Signed)
Pt calling for testing results. He completed his stress test and echo on 05/06/2022. Event monitor was completed on 04/23/2022.    Should pt continue to take Diltiazem?    He wants to know if he is cleared to resume yard work and go to [employer]?     Pt # [id number]

## 2022-05-14 NOTE — Telephone Encounter (Signed)
Pt aware that test are benign and to continue with Diltiazem

## 2022-09-22 ENCOUNTER — Ambulatory Visit: Admit: 2022-09-22 | Discharge: 2022-09-22 | Payer: TRICARE (CHAMPUS) | Attending: Family | Primary: Internal Medicine

## 2022-09-22 DIAGNOSIS — K1379 Other lesions of oral mucosa: Secondary | ICD-10-CM

## 2022-09-22 MED ORDER — AMOXICILLIN 875 MG PO TABS
875 | ORAL_TABLET | Freq: Two times a day (BID) | ORAL | 1 refills | Status: AC
Start: 2022-09-22 — End: 2022-09-29

## 2022-09-22 NOTE — Progress Notes (Signed)
Subjective  Ronald Garza, 47 y.o. male presents today with:  Chief Complaint   Patient presents with    Pharyngitis     X 2 weeks; on the roof of the mouth and has a texture of a mass that he had removed in 2005        HPI  Presents to [facility] for oral pain   Affected area: hard palate   Describes the discomfort as if pushing on a bruise   The discomfort radiates to the back of his mouth/throat   Reports he at times has difficulty swallowing   He has been eating and drinking   Yrs prior had lesion of the pharynx removed. Was told it was benign. The current affected site is at this same location.   Denies fever or chills   Denies burning mouth on hot fluid/food nor food causing the changes to the affected site of mouth  Denies trauma or injury occurring to hard palate                   No past medical history on file.   No past surgical history on file.  No family history on file.          Review of Systems   Constitutional:  Negative for activity change, appetite change, chills, diaphoresis, fatigue and fever.   HENT:  Positive for sore throat. Negative for dental problem, drooling, ear pain, postnasal drip, rhinorrhea and voice change. Trouble swallowing: off/on.   Respiratory:  Negative for cough.    Gastrointestinal:  Negative for abdominal pain, diarrhea, nausea and vomiting.   Musculoskeletal:  Negative for neck stiffness.         PMH, Surgical Hx, Family Hx, and Social Hx reviewed and updated.  Health Maintenance reviewed.          Objective  Vitals:    09/22/22 1240   BP: 118/76   Pulse: 94   Temp: 97.3 F (36.3 C)   TempSrc: Temporal   SpO2: 98%   Weight: 117.9 kg (260 lb)   Height: 1.829 m (6')     BP Readings from Last 3 Encounters:   09/22/22 118/76   05/06/22 130/82   04/08/22 130/82     Wt Readings from Last 3 Encounters:   09/22/22 117.9 kg (260 lb)   05/06/22 113.9 kg (251 lb)   04/08/22 114.2 kg (251 lb 12.8 oz)           Physical Exam  Vitals reviewed.   Constitutional:       General: He is  awake. He is not in acute distress.     Appearance: He is not toxic-appearing.   HENT:      Right Ear: External ear normal.      Left Ear: External ear normal.      Nose: Nose normal.      Mouth/Throat:      Lips: Pink.      Mouth: Mucous membranes are moist.      Pharynx: Uvula midline. No pharyngeal swelling, oropharyngeal exudate, posterior oropharyngeal erythema or uvula swelling.     Eyes:      Extraocular Movements: Extraocular movements intact.      Conjunctiva/sclera: Conjunctivae normal.   Cardiovascular:      Rate and Rhythm: Normal rate.   Pulmonary:      Effort: Pulmonary effort is normal.   Musculoskeletal:      Cervical back: Normal range of motion. No rigidity.   Lymphadenopathy:  Cervical: No cervical adenopathy.   Skin:     General: Skin is warm and dry.   Neurological:      General: No focal deficit present.      Mental Status: He is alert and oriented to person, place, and time.               Assessment & Plan    Diagnosis Orders   1. Oral soft tissue complaint  AFL - Mosetta Anis, MD, Otolaryngology, [address]    amoxicillin (AMOXIL) 875 MG tablet        Orders Placed This Encounter   Procedures    AFL - Mosetta Anis, MD, Otolaryngology, [facility]     Referral Priority:   Routine     Referral Type:   Eval and Treat     Referral Reason:   Specialty Services Required     Referred to Provider:   Aurea Graff, MD     Requested Specialty:   Otolaryngology     Number of Visits Requested:   1     Orders Placed This Encounter   Medications    amoxicillin (AMOXIL) 875 MG tablet     Sig: Take 1 tablet by mouth 2 times daily for 7 days     Dispense:  14 tablet     Refill:  1         Schedule with ENT for an evaluation   Pt aware of ENT referral       Reviewed with the patient: current clinical status & medication.  Side effects, adverse effects of the medication prescribed today, as well as treatment plan/rationale and result expectations have been discussed with the patient who expressed  understanding.    Close follow up to evaluate treatment results and for coordination of care.  I have reviewed the patient's medical history in detail and updated the computerized patient record.      Laveda Abbe, APRN - NP

## 2023-04-13 ENCOUNTER — Inpatient Hospital Stay: Payer: TRICARE (CHAMPUS) | Attending: Specialist

## 2023-04-13 MED ORDER — SODIUM CHLORIDE 0.9 % IV SOLN
0.9 | INTRAVENOUS | Status: DC | PRN
Start: 2023-04-13 — End: 2023-04-13

## 2023-04-13 MED ORDER — NORMAL SALINE FLUSH 0.9 % IV SOLN
0.9 | Freq: Two times a day (BID) | INTRAVENOUS | Status: DC
Start: 2023-04-13 — End: 2023-04-13

## 2023-04-13 MED ORDER — SODIUM CHLORIDE 0.9 % IV SOLN
0.9 | INTRAVENOUS | Status: DC
Start: 2023-04-13 — End: 2023-04-13
  Administered 2023-04-13: 13:00:00 via INTRAVENOUS

## 2023-04-13 MED ORDER — PROPOFOL 200 MG/20ML IV EMUL
200 | Freq: Once | INTRAVENOUS | Status: DC | PRN
Start: 2023-04-13 — End: 2023-04-13
  Administered 2023-04-13: 13:00:00 50 via INTRAVENOUS
  Administered 2023-04-13: 13:00:00 100 via INTRAVENOUS
  Administered 2023-04-13 (×3): 50 via INTRAVENOUS

## 2023-04-13 MED ORDER — NORMAL SALINE FLUSH 0.9 % IV SOLN
0.9 | INTRAVENOUS | Status: DC | PRN
Start: 2023-04-13 — End: 2023-04-13

## 2023-04-13 MED ORDER — STERILE WATER FOR IRRIGATION IR SOLN
Status: DC | PRN
Start: 2023-04-13 — End: 2023-04-13
  Administered 2023-04-13: 13:00:00 1000

## 2023-04-13 MED FILL — MONOJECT FLUSH SYRINGE 0.9 % IV SOLN: 0.9 % | INTRAVENOUS | Qty: 40

## 2023-04-13 MED FILL — SODIUM CHLORIDE 0.9 % IV SOLN: 0.9 % | INTRAVENOUS | Qty: 500

## 2023-04-13 NOTE — Anesthesia Pre Procedure (Signed)
 Department of Anesthesiology  Preprocedure Note       Name:  Ronald Garza   Age:  47 y.o.  DOB:  01-11-1976                                          MRN:  [id number]         Date:  04/13/2023      Surgeon: Clotilde):  Leroy Karlene DASEN, MD    Procedure: Procedure(s):  COLORECTAL CANCER SCREENING, HIGH RISK    Medications prior to admission:   Prior to Admission medications    Medication Sig Start Date End Date Taking? Authorizing Provider   ARIPiprazole (ABILIFY) 2 MG tablet Take 1 tablet by mouth nightly   Yes [provider]   hydrOXYzine pamoate (VISTARIL) 25 MG capsule Take 1 capsule by mouth 4 times daily as needed for Anxiety 08/19/22  Yes [provider]   dilTIAZem  (CARDIZEM  CD) 120 MG extended release capsule Take 1 capsule by mouth daily 04/04/22 04/13/23 Yes Roman, Madeline, PA-C   cetirizine (ZYRTEC) 10 MG chewable tablet Take 1 tablet by mouth daily   Yes [provider]   albuterol sulfate HFA (PROVENTIL;VENTOLIN;PROAIR) 108 (90 Base) MCG/ACT inhaler Inhale 2 puffs into the lungs every 4 hours as needed   Yes [provider]   doxycycline hyclate (VIBRAMYCIN) 100 MG capsule Take 1 capsule by mouth 2 times daily   Yes [provider]   DULoxetine (CYMBALTA) 60 MG extended release capsule Take 1 capsule by mouth 2 times daily 02/18/22  Yes [provider]   SUMAtriptan (IMITREX) 100 MG tablet 1 tablet 09/07/19  Yes [provider]   SUMAtriptan Succinate 6 MG/0.5ML SOSY injection Inject into the skin   Yes [provider]   benzonatate (TESSALON) 100 MG capsule 1 capsule  Patient not taking: Reported on 09/22/2022 03/27/22   [provider]       Current medications:    Current Facility-Administered Medications   Medication Dose Route Frequency Provider Last Rate Last Admin    0.9 % sodium chloride  infusion   IntraVENous Continuous Leroy Karlene DASEN, MD 75 mL/hr at 04/13/23 0910 New Bag at 04/13/23 0910    sodium chloride  flush 0.9 %  injection 5-40 mL  5-40 mL IntraVENous 2 times per day Leroy Karlene T, MD        sodium chloride  flush 0.9 % injection 5-40 mL  5-40 mL IntraVENous PRN Razack, Karlene DASEN, MD        0.9 % sodium chloride  infusion   IntraVENous PRN Leroy Karlene DASEN, MD           Allergies:    Allergies   Allergen Reactions    Erythromycin Anaphylaxis    Gabapentin Swelling       Problem List:  There is no problem list on file for this patient.      Past Medical History:  History reviewed. No pertinent past medical history.    Past Surgical History:  History reviewed. No pertinent surgical history.    Social History:    Social History     Tobacco Use    Smoking status: Former     Current packs/day: 0.00     Types: Cigarettes     Quit date: 2005     Years since quitting: 19.7    Smokeless tobacco: Never   Substance  Use Topics    Alcohol use: Not Currently                                Counseling given: Not Answered      Vital Signs (Current):   Vitals:    04/13/23 0908   BP: 130/87   Pulse: 88   Resp: 18   Temp: 36.4 C (97.6 F)   TempSrc: Temporal   SpO2: 96%   Weight: 120.2 kg (265 lb)   Height: 1.829 m (6')                                              BP Readings from Last 3 Encounters:   04/13/23 130/87   09/22/22 118/76   05/06/22 130/82       NPO Status: Time of last liquid consumption: 0600 (prep)                        Time of last solid consumption: 1800                        Date of last liquid consumption: 04/13/23 (prep)                        Date of last solid food consumption: 04/11/23    BMI:   Wt Readings from Last 3 Encounters:   04/13/23 120.2 kg (265 lb)   09/22/22 117.9 kg (260 lb)   05/06/22 113.9 kg (251 lb)     Body mass index is 35.94 kg/m.    CBC:   Lab Results   Component Value Date/Time    WBC 14.3 04/04/2022 01:45 PM    RBC 5.98 04/04/2022 01:45 PM    HGB 16.3 04/04/2022 01:45 PM    HCT 48.7 04/04/2022 01:45 PM    MCV 81.6 04/04/2022 01:45 PM    RDW 15.2 04/04/2022 01:45 PM    PLT 345 04/04/2022 01:45 PM        CMP:   Lab Results   Component Value Date/Time    NA 142 04/04/2022 01:45 PM    K 3.7 04/04/2022 01:45 PM    CL 102 04/04/2022 01:45 PM    CO2 25 04/04/2022 01:45 PM    BUN 12 04/04/2022 01:45 PM    CREATININE 0.8 04/04/2022 02:03 PM    CREATININE 0.85 04/04/2022 01:45 PM    LABGLOM >60 04/04/2022 02:03 PM    GLUCOSE 132 04/04/2022 01:45 PM    CALCIUM 9.4 04/04/2022 01:45 PM    BILITOT 0.3 04/04/2022 01:45 PM    ALKPHOS 92 04/04/2022 01:45 PM    AST 17 04/04/2022 01:45 PM    ALT 28 04/04/2022 01:45 PM       POC Tests: No results for input(s): POCGLU, POCNA, POCK, POCCL, POCBUN, POCHEMO, POCHCT in the last 72 hours.    Coags:   Lab Results   Component Value Date/Time    PROTIME 13.0 04/04/2022 01:45 PM    INR 1.0 04/04/2022 01:45 PM    APTT 24.0 04/04/2022 01:45 PM       HCG (If Applicable): No results found for: PREGTESTUR, PREGSERUM, HCG, HCGQUANT     ABGs: No results found for: PHART, PO2ART, PCO2ART, HCO3ART, BEART,  O2SATART     Type & Screen (If Applicable):  No results found for: ABORH, LABANTI    Drug/Infectious Status (If Applicable):  No results found for: HIV, HEPCAB    COVID-19 Screening (If Applicable): No results found for: COVID19        Anesthesia Evaluation  Patient summary reviewed and Nursing notes reviewed  Airway: Mallampati: II          Dental: normal exam         Pulmonary:Negative Pulmonary ROS and normal exam                               Cardiovascular:Negative CV ROS            Rhythm: regular                      Neuro/Psych:   Negative Neuro/Psych ROS              GI/Hepatic/Renal: Neg GI/Hepatic/Renal ROS            Endo/Other: Negative Endo/Other ROS                    Abdominal:             Vascular: negative vascular ROS.         Other Findings:             Anesthesia Plan      MAC     ASA 2             Anesthetic plan and risks discussed with patient.      Plan discussed with CRNA.                    Isaiah Masson, APRN - CRNA    04/13/2023

## 2023-04-13 NOTE — H&P (Signed)
 Patient Name: Ronald Garza  DOB: 1975/11/25  MRN: [id number]  DATE: 04/13/23      ENDOSCOPY  History and Physical    Procedure:    [x]  Diagnostic Colonoscopy       []  Screening Colonoscopy  []  EGD      []  ERCP      []  EUS       []  Other    [x]  Previous office notes/History and Physical reviewed from the patients chart. Please see EMR for further details of HPI. I have examined the patient's status immediately prior to the procedure and:      Indications/HPI:    [] Abdominal Pain  [] Cancer- GI/Lung  [] Fhx of colon CA/polyps  [] History of Polyps  [] Barrett's   [] Melena  [] Abnormal Imaging  [] Dysphagia    [] Persistent Pneumonia  [] Anemia  [] Food Impaction  [] History of Polyps  [] GI Bleed  [] Pulmonary nodule/Mass  [] Change in bowel habits [] Heartburn/Reflux  [] Rectal Bleed (BRBPR)  [] Chest Pain - Non Cardiac [] Heme (+) Stoo  l[] Ulcers  [] Constipation  [] Hemoptysis   [] Varices  [] Diarrhea  [] Hypoxemia  [] Nausea/Vomiting  [] Screening   [] Crohns/Colitis  [] Other: h/o colon polyp    Anesthesia:   [x]  MAC []  Moderate Sedation   []  General   []  None     ROS: 12 pt Review of Symptoms was negative unless mentioned above    Medications:   Prior to Admission medications    Medication Sig Start Date End Date Taking? Authorizing Provider   ARIPiprazole (ABILIFY) 2 MG tablet Take 1 tablet by mouth nightly   Yes [provider]   hydrOXYzine pamoate (VISTARIL) 25 MG capsule Take 1 capsule by mouth 4 times daily as needed for Anxiety 08/19/22  Yes [provider]   dilTIAZem  (CARDIZEM  CD) 120 MG extended release capsule Take 1 capsule by mouth daily 04/04/22 04/13/23 Yes Roman, Madeline, PA-C   cetirizine (ZYRTEC) 10 MG chewable tablet Take 1 tablet by mouth daily   Yes [provider]   albuterol sulfate HFA (PROVENTIL;VENTOLIN;PROAIR) 108 (90 Base) MCG/ACT inhaler Inhale 2 puffs into the lungs every 4 hours as needed   Yes [provider]   doxycycline hyclate (VIBRAMYCIN) 100 MG capsule Take 1 capsule  by mouth 2 times daily   Yes [provider]   DULoxetine (CYMBALTA) 60 MG extended release capsule Take 1 capsule by mouth 2 times daily 02/18/22  Yes [provider]   SUMAtriptan (IMITREX) 100 MG tablet 1 tablet 09/07/19  Yes [provider]   SUMAtriptan Succinate 6 MG/0.5ML SOSY injection Inject into the skin   Yes [provider]   benzonatate (TESSALON) 100 MG capsule 1 capsule  Patient not taking: Reported on 09/22/2022 03/27/22   [provider]       Allergies:   Allergies   Allergen Reactions    Erythromycin Anaphylaxis    Gabapentin Swelling        History of allergic reaction to anesthesia:  No    Past Medical History:  History reviewed. No pertinent past medical history.    Past Surgical History:  History reviewed. No pertinent surgical history.    Social History:  Social History     Tobacco Use    Smoking status: Former     Current packs/day: 0.00     Types: Cigarettes     Quit date: 2005     Years since quitting: 19.7    Smokeless tobacco: Never   Substance Use Topics    Alcohol  use: Not Currently    Drug use: Never       Vital Signs:   Vitals:    04/13/23 0908   BP: 130/87   Pulse: 88   Resp: 18   Temp: 97.6 F (36.4 C)   SpO2: 96%        Physical Exam:  Cardiac:  [x] WNL  [] Comments:  Pulmonary:  [x] WNL   [] Comments:   Neuro/Mental Status:  [x] WNL  [] Comments:  Abdominal:  [x] WNL    [] Comments:  Other:   [] WNL  [] Comments:    Informed Consent:  The risks and benefits of the procedure have been discussed with either the patient or if they cannot consent, their representative.    Assessment:  Patient examined and appropriate for planned sedation and procedure.     Plan:  Proceed with planned sedation and procedure as above.    Karlene ONEIDA Glad, MD  9:17 AM

## 2023-04-13 NOTE — Anesthesia Postprocedure Evaluation (Signed)
 Department of Anesthesiology  Postprocedure Note    Patient: Ronald Garza  MRN: [id number]  Birthdate: 1975-10-19  Date of evaluation: 04/13/2023    Procedure Summary       Date: 04/13/23 Room / Location: [facility] OR 01 / [facility]    Anesthesia Start: [phone] Anesthesia Stop: 0932    Procedure: COLORECTAL CANCER SCREENING, HIGH RISK Diagnosis:       History of colon polyps      (History of colon polyps [Z86.010])    Surgeons: Leroy Karlene DASEN, MD Responsible Provider: Roddie Rake, APRN - CRNA    Anesthesia Type: MAC ASA Status: 2            Anesthesia Type: No value filed.    Aldrete Phase I: Aldrete Score: 10    Aldrete Phase II:      Anesthesia Post Evaluation    Patient location during evaluation: bedside  Patient participation: complete - patient participated  Level of consciousness: awake and awake and alert  Airway patency: patent  Nausea & Vomiting: no nausea and no vomiting  Cardiovascular status: blood pressure returned to baseline and hemodynamically stable  Respiratory status: acceptable  Hydration status: euvolemic  Pain management: adequate        No notable events documented.

## 2023-09-01 ENCOUNTER — Ambulatory Visit: Admit: 2023-09-01 | Discharge: 2023-09-01 | Payer: TRICARE (CHAMPUS) | Attending: Surgical | Primary: Internal Medicine

## 2023-09-01 VITALS — BP 136/92 | HR 83 | Ht 72.0 in | Wt 269.0 lb

## 2023-09-01 DIAGNOSIS — E669 Obesity, unspecified: Secondary | ICD-10-CM

## 2023-09-01 MED ORDER — TIRZEPATIDE-WEIGHT MANAGEMENT 2.5 MG/0.5ML SC SOAJ
2.50.5 | SUBCUTANEOUS | 5 refills | Status: AC
Start: 2023-09-01 — End: ?

## 2023-09-01 NOTE — Progress Notes (Signed)
09/01/2023    Assessment:       Diagnosis Orders   1. Obesity (BMI 35.0-39.9 without comorbidity)          PLAN:     Zepbound 2.5 mg injected weekly   Repeat labs one week before next appointment   Follow up in 4 months      No orders of the defined types were placed in this encounter.    No orders of the defined types were placed in this encounter.    No follow-ups on file.  Subjective:     Chief Complaint   Patient presents with    Obesity     Vitals:    09/01/23 1007   BP: (!) 136/92   Site: Left Upper Arm   Position: Sitting   Cuff Size: Large Adult   Pulse: 83   Weight: 122 kg (269 lb)   Height: 1.829 m (6')     Wt Readings from Last 3 Encounters:   09/01/23 122 kg (269 lb)   04/13/23 120.2 kg (265 lb)   09/22/22 117.9 kg (260 lb)     BP Readings from Last 3 Encounters:   09/01/23 (!) 136/92   04/13/23 112/70   09/22/22 118/76     Ronald Garza is a 48 year old male presents today for evaluation and treatment of obesity.  He is from the [place], [place] in [place] program, and presents today for some prescription medications to help with his weight loss goals.  Unfortunately he has a multiple medical issues including spinal issues, and broken feet in the past that prohibit him from any strenuous physical exercise.  We discussed water therapy and swimming and that this may facilitate some weight loss with the minimal impact on his previous injuries.  He has used Adipex years ago, states that it did not really suppress his appetite and just gave him heart palpitations.  He has a history of arrhythmias and is on diltiazem.  I have prescribed him Zepbound 2.5 mg injected weekly to help facilitate his weight loss.  Discussed the benefits and risks including but not limited to nausea, vomiting, diarrhea, pancreatitis, possible MTC and M EN.  He verbalized understanding      History reviewed. No pertinent past medical history.  Past Surgical History:   Procedure Laterality Date    COLONOSCOPY N/A 04/13/2023    COLORECTAL  CANCER SCREENING, HIGH RISK performed by Mirian Capuchin, MD at [facility]     Social History     Socioeconomic History    Marital status: Widowed     Spouse name: Not on file    Number of children: Not on file    Years of education: Not on file    Highest education level: Not on file   Occupational History    Not on file   Tobacco Use    Smoking status: Former     Current packs/day: 0.00     Types: Cigarettes     Quit date: 2005     Years since quitting: 20.1    Smokeless tobacco: Never   Substance and Sexual Activity    Alcohol use: Not Currently    Drug use: Never    Sexual activity: Not on file   Other Topics Concern    Not on file   Social History Narrative    Not on file     Social Determinants of Health     Financial Resource Strain: Low Risk  (09/22/2022)  Overall Financial Resource Strain (CARDIA)     Difficulty of Paying Living Expenses: Not hard at all   Food Insecurity: No Food Insecurity (09/22/2022)    Hunger Vital Sign     Worried About Running Out of Food in the Last Year: Never true     Ran Out of Food in the Last Year: Never true   Transportation Needs: Unknown (09/22/2022)    PRAPARE - Therapist, art (Medical): Not on file     Lack of Transportation (Non-Medical): No   Physical Activity: Not on file   Stress: Not on file   Social Connections: Unknown (12/02/2021)    Received from [facility], [facility]    Social Network     Social Network: Not on file   Intimate Partner Violence: Unknown (10/23/2021)    Received from [facility], [facility]    HITS     Physically Hurt: Not on file     Insult or Talk Down To: Not on file     Threaten Physical Harm: Not on file     Scream or Curse: Not on file   Housing Stability: Unknown (09/22/2022)    Housing Stability Vital Sign     Unable to Pay for Housing in the Last Year: Not on file     Number of Places Lived in the Last Year: Not on file     Unstable Housing in the Last Year: No     Family History   Problem Relation Age  of Onset    Colon Cancer Maternal Grandmother      Allergies   Allergen Reactions    Erythromycin Anaphylaxis    Gabapentin Swelling       Current Outpatient Medications:     fluticasone-umeclidin-vilant (TRELEGY ELLIPTA) 200-62.5-25 MCG/ACT AEPB inhaler, Inhale 1 puff into the lungs daily, Disp: , Rfl:     traZODone (DESYREL) 50 MG tablet, Take 1 tablet by mouth nightly, Disp: , Rfl:     hydrOXYzine pamoate (VISTARIL) 25 MG capsule, Take 1 capsule by mouth 4 times daily as needed for Anxiety, Disp: , Rfl:     cetirizine (ZYRTEC) 10 MG chewable tablet, Take 1 tablet by mouth daily, Disp: , Rfl:     albuterol sulfate HFA (PROVENTIL;VENTOLIN;PROAIR) 108 (90 Base) MCG/ACT inhaler, Inhale 2 puffs into the lungs every 4 hours as needed, Disp: , Rfl:     doxycycline hyclate (VIBRAMYCIN) 100 MG capsule, Take 1 capsule by mouth 2 times daily, Disp: , Rfl:     DULoxetine (CYMBALTA) 60 MG extended release capsule, Take 1 capsule by mouth 2 times daily, Disp: , Rfl:     SUMAtriptan (IMITREX) 100 MG tablet, 1 tablet, Disp: , Rfl:     SUMAtriptan Succinate 6 MG/0.5ML SOSY injection, Inject into the skin, Disp: , Rfl:     dilTIAZem (CARDIZEM CD) 120 MG extended release capsule, Take 1 capsule by mouth daily, Disp: 30 capsule, Rfl: 0  Lab Results   Component Value Date    NA 142 04/04/2022    K 3.7 04/04/2022    CL 102 04/04/2022    CO2 25 04/04/2022    BUN 12 04/04/2022    CREATININE 0.8 04/04/2022    GLUCOSE 132 (H) 04/04/2022    CALCIUM 9.4 04/04/2022    BILITOT 0.3 04/04/2022    ALKPHOS 92 04/04/2022    AST 17 04/04/2022    ALT 28 04/04/2022    LABGLOM >60 04/04/2022    GLOB  3.0 04/04/2022     Lab Results   Component Value Date    WBC 14.3 (H) 04/04/2022    HGB 16.3 04/04/2022    HCT 48.7 04/04/2022    MCV 81.6 04/04/2022    PLT 345 04/04/2022     No results found for: "LABA1C"  No results found for: "CHOLFAST", "TRIGLYCFAST", "HDL", "CHOL", "TRIG"  No results found for: "TESTOSTERONE", "SHBG", "TESTFREENM"  Lab Results    Component Value Date    TSH 1.180 04/04/2022     No results found for: "TPOABS"    Review of Systems   Constitutional:  Negative for chills, fatigue and fever.   HENT:  Negative for congestion, ear pain, postnasal drip, rhinorrhea, sinus pressure and sore throat.    Eyes:  Negative for visual disturbance.   Respiratory:  Negative for cough, shortness of breath and wheezing.    Cardiovascular:  Negative for chest pain, palpitations and leg swelling.   Gastrointestinal:  Negative for abdominal pain, diarrhea, nausea and vomiting.   Endocrine: Negative for cold intolerance, heat intolerance, polydipsia and polyuria.        Patient denies history of pancreatitis, alcohol abuse, family or personal history of thyroid cancer, MEN 2, MTC.    Genitourinary:  Negative for difficulty urinating.   Musculoskeletal:  Negative for arthralgias.   Skin:  Negative for rash.   Allergic/Immunologic: Negative for environmental allergies.   Neurological:  Negative for dizziness and headaches.   Hematological:  Does not bruise/bleed easily.   Psychiatric/Behavioral:  Negative for dysphoric mood.        Objective:   Physical Exam  Vitals reviewed.   Constitutional:       Appearance: Normal appearance. He is well-developed. He is not ill-appearing.   HENT:      Head: Normocephalic and atraumatic.      Nose: No congestion.   Eyes:      Conjunctiva/sclera: Conjunctivae normal.   Cardiovascular:      Rate and Rhythm: Normal rate and regular rhythm.      Heart sounds: Normal heart sounds.   Pulmonary:      Effort: Pulmonary effort is normal.      Breath sounds: Normal breath sounds.   Abdominal:      General: Bowel sounds are normal.      Palpations: Abdomen is soft.   Musculoskeletal:         General: Normal range of motion.      Cervical back: Normal range of motion and neck supple.   Skin:     General: Skin is warm and dry.   Neurological:      General: No focal deficit present.      Mental Status: He is alert and oriented to person,  place, and time.   Psychiatric:         Mood and Affect: Mood normal.           Reviewed with the patient: current clinical status, medications, activities and diet.      Side effects, adverse effects of the medication prescribed today, as well as treatment plan/ rationale and result expectations have been discussed with the patient who expresses understanding and desires to proceed.     Close follow up to evaluate treatment results and for coordination of care.  I have reviewed the patient's medical history in detail and updated the computerized patient record.

## 2023-09-22 NOTE — Telephone Encounter (Signed)
 Pt calling the [facility] denied the script for zepbound, The denial is in media.  There is a list of covered medications.  He wants to know if he can be put on one of the covered meds.  He is aware Arlys John is out till Thursday

## 2023-09-24 NOTE — Telephone Encounter (Signed)
 I called and spoke to the patient about the [facility] denying his prescription for Zepbound.  And their letter they state that there are other options that they will cover however the patient is not a candidate due to his history of arrhythmias, tachycardia, high blood pressure, and seizure history.  The patient verbalized understanding.

## 2024-01-05 ENCOUNTER — Encounter: Payer: TRICARE (CHAMPUS) | Attending: Surgical | Primary: Internal Medicine

## 2024-07-27 ENCOUNTER — Ambulatory Visit
Admit: 2024-07-27 | Discharge: 2024-07-27 | Payer: TRICARE (CHAMPUS) | Attending: "Endocrinology | Primary: Internal Medicine

## 2024-07-27 VITALS — BP 97/67 | HR 104 | Ht 72.01 in | Wt 266.0 lb

## 2024-07-27 DIAGNOSIS — E669 Obesity, unspecified: Secondary | ICD-10-CM

## 2024-07-27 MED ORDER — TOPIRAMATE 50 MG PO TABS
50 | ORAL_TABLET | Freq: Two times a day (BID) | ORAL | 3 refills | 30.00000 days | Status: AC
Start: 2024-07-27 — End: ?

## 2024-07-27 MED ORDER — TIRZEPATIDE-WEIGHT MANAGEMENT 2.5 MG/0.5ML SC SOAJ
2.5 | SUBCUTANEOUS | 3 refills | 28.00000 days | Status: AC
Start: 2024-07-27 — End: ?

## 2024-07-27 NOTE — Progress Notes (Signed)
 07/27/2024    Assessment:       Diagnosis Orders   1. Obesity (BMI 35.0-39.9 without comorbidity)        2. Weight gain              PLAN:     Start patient on Topamax  and Zepbound   Follow-up in 3 months  Orders Placed This Encounter   Medications    topiramate  (TOPAMAX ) 50 MG tablet     Sig: Take 1 tablet by mouth 2 times daily     Dispense:  60 tablet     Refill:  3    tirzepatide -weight management (ZEPBOUND ) 2.5 MG/0.5ML SOAJ subCUTAneous auto-injector pen     Sig: Inject 2.5 mg into the skin every 7 days     Dispense:  2 mL     Refill:  3     History of chronic tachycardia pt unable stimulants like phentermine       Subjective:     Chief Complaint   Patient presents with    New Patient    Obesity     [facility] Referral     Vitals:    07/27/24 1304   BP: 97/67   Pulse: (!) 104   Weight: 120.7 kg (266 lb)   Height: 1.829 m (6' 0.01)     Wt Readings from Last 3 Encounters:   07/27/24 120.7 kg (266 lb)   09/01/23 122 kg (269 lb)   04/13/23 120.2 kg (265 lb)     BP Readings from Last 3 Encounters:   07/27/24 97/67   09/01/23 (!) 136/92   04/13/23 112/70     Patient is here for follow-up on obesity was seen by Redell Gravely earlier unable to get GLP medications due to different reasons wants to restart resume treatment with GLP including Zepbound  patient unable to take phentermine because of tachycardia  History of migraine has not tried Topamax  discussed benefits of Topamax  for weight loss BMI is 36 denies any history of sleep apnea thyroid disease diabetes    Weight Management  This is a new problem. The current episode started more than 1 year ago. The problem has been waxing and waning.     No past medical history on file.  Past Surgical History:   Procedure Laterality Date    COLONOSCOPY N/A 04/13/2023    COLORECTAL CANCER SCREENING, HIGH RISK performed by Leroy Karlene DASEN, MD at [facility]     Social History     Socioeconomic History    Marital status: Widowed     Spouse name: Not on file    Number of children:  Not on file    Years of education: Not on file    Highest education level: Not on file   Occupational History    Not on file   Tobacco Use    Smoking status: Former     Current packs/day: 0.00     Types: Cigarettes     Quit date: 2005     Years since quitting: 21.0    Smokeless tobacco: Never   Substance and Sexual Activity    Alcohol use: Not Currently    Drug use: Never    Sexual activity: Not on file   Other Topics Concern    Not on file   Social History Narrative    Not on file     Social Drivers of Health     Financial Resource Strain: Low Risk  (09/22/2022)    Overall Financial  Resource Strain (CARDIA)     Difficulty of Paying Living Expenses: Not hard at all   Food Insecurity: No Food Insecurity (09/22/2022)    Hunger Vital Sign     Worried About Running Out of Food in the Last Year: Never true     Ran Out of Food in the Last Year: Never true   Transportation Needs: Unknown (09/22/2022)    PRAPARE - Therapist, Art (Medical): Not on file     Lack of Transportation (Non-Medical): No   Physical Activity: Not on file   Stress: Not on file   Social Connections: Unknown (12/02/2021)    Received from [facility]    Social Network     Social Network: Not on file   Intimate Partner Violence: Unknown (10/23/2021)    Received from [facility]    HITS     Physically Hurt: Not on file     Insult or Talk Down To: Not on file     Threaten Physical Harm: Not on file     Scream or Curse: Not on file   Housing Stability: Unknown (09/22/2022)    Housing Stability Vital Sign     Unable to Pay for Housing in the Last Year: Not on file     Number of Places Lived in the Last Year: Not on file     Unstable Housing in the Last Year: No     Family History   Problem Relation Age of Onset    Colon Cancer Maternal Grandmother      Allergies   Allergen Reactions    Erythromycin Anaphylaxis    Gabapentin Swelling       Current Outpatient Medications:     dicyclomine (BENTYL) 20 MG tablet, Take 1 tablet by mouth, Disp: ,  Rfl:     busPIRone (BUSPAR) 10 MG tablet, Take 1 tablet by mouth 2 times daily, Disp: , Rfl:     benzonatate (TESSALON) 100 MG capsule, Take 1 capsule by mouth, Disp: , Rfl:     Budeson-Glycopyrrol-Formoterol 160-9-4.8 MCG/ACT AERO, Inhale into the lungs, Disp: , Rfl:     fluticasone-umeclidin-vilant (TRELEGY ELLIPTA) 200-62.5-25 MCG/ACT AEPB inhaler, Inhale 1 puff into the lungs daily, Disp: , Rfl:     traZODone (DESYREL) 50 MG tablet, Take 1 tablet by mouth nightly, Disp: , Rfl:     hydrOXYzine pamoate (VISTARIL) 25 MG capsule, Take 1 capsule by mouth 4 times daily as needed for Anxiety, Disp: , Rfl:     dilTIAZem  (CARDIZEM  CD) 120 MG extended release capsule, Take 1 capsule by mouth daily, Disp: 30 capsule, Rfl: 0    cetirizine (ZYRTEC) 10 MG chewable tablet, Take 1 tablet by mouth daily, Disp: , Rfl:     albuterol sulfate HFA (PROVENTIL;VENTOLIN;PROAIR) 108 (90 Base) MCG/ACT inhaler, Inhale 2 puffs into the lungs every 4 hours as needed, Disp: , Rfl:     doxycycline hyclate (VIBRAMYCIN) 100 MG capsule, Take 1 capsule by mouth 2 times daily, Disp: , Rfl:     DULoxetine (CYMBALTA) 60 MG extended release capsule, Take 1 capsule by mouth 2 times daily, Disp: , Rfl:     SUMAtriptan (IMITREX) 100 MG tablet, 1 tablet, Disp: , Rfl:     SUMAtriptan Succinate 6 MG/0.5ML SOSY injection, Inject into the skin, Disp: , Rfl:     tirzepatide -weight management (ZEPBOUND ) 2.5 MG/0.5ML SOAJ subCUTAneous auto-injector pen, Inject 2.5 mg into the skin every 7 days (Patient not taking: Reported on 07/27/2024), Disp:  2 mL, Rfl: 5  Lab Results   Component Value Date    NA 142 04/04/2022    K 3.7 04/04/2022    CL 102 04/04/2022    CO2 25 04/04/2022    BUN 12 04/04/2022    CREATININE 0.8 04/04/2022    GLUCOSE 132 (H) 04/04/2022    CALCIUM 9.4 04/04/2022    BILITOT 0.3 04/04/2022    ALKPHOS 92 04/04/2022    AST 17 04/04/2022    ALT 28 04/04/2022    LABGLOM >60 04/04/2022    GLOB 3.0 04/04/2022     Lab Results   Component Value Date    WBC  14.3 (H) 04/04/2022    HGB 16.3 04/04/2022    HCT 48.7 04/04/2022    MCV 81.6 04/04/2022    PLT 345 04/04/2022     No results found for: LABA1C  No results found for: CHOLFAST, TRIGLYCFAST, HDL, CHOL, TRIG  No results found for: TESTM  Lab Results   Component Value Date    TSH 1.180 04/04/2022     No results found for: TPOABS    Review of Systems   Constitutional:  Positive for unexpected weight change.   Cardiovascular: Negative.    Endocrine: Negative.    All other systems reviewed and are negative.      Objective:   Physical Exam  Vitals reviewed.   Constitutional:       General: He is not in acute distress.     Appearance: Normal appearance. He is obese.   HENT:      Head: Normocephalic and atraumatic.      Right Ear: External ear normal.      Left Ear: External ear normal.      Nose: Nose normal.   Eyes:      General: No scleral icterus.        Right eye: No discharge.         Left eye: No discharge.      Extraocular Movements: Extraocular movements intact.      Conjunctiva/sclera: Conjunctivae normal.   Cardiovascular:      Rate and Rhythm: Tachycardia present.   Pulmonary:      Effort: Pulmonary effort is normal.   Musculoskeletal:         General: Normal range of motion.      Cervical back: Normal range of motion and neck supple.   Neurological:      General: No focal deficit present.      Mental Status: He is alert and oriented to person, place, and time.   Psychiatric:         Mood and Affect: Mood normal.         Behavior: Behavior normal.
